# Patient Record
Sex: Male | Born: 1995 | Race: Black or African American | Hispanic: No | Marital: Single | Smoking: Former smoker
Health system: Southern US, Community
[De-identification: ages and names within clinical notes are randomized; demographics above are authoritative.]

## PROBLEM LIST (undated history)

## (undated) DIAGNOSIS — F419 Anxiety disorder, unspecified: Secondary | ICD-10-CM

## (undated) DIAGNOSIS — J45901 Unspecified asthma with (acute) exacerbation: Secondary | ICD-10-CM

## (undated) DIAGNOSIS — M549 Dorsalgia, unspecified: Secondary | ICD-10-CM

## (undated) DIAGNOSIS — Q774 Achondroplasia: Secondary | ICD-10-CM

## (undated) DIAGNOSIS — J45909 Unspecified asthma, uncomplicated: Secondary | ICD-10-CM

---
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
## 2016-11-11 ENCOUNTER — Encounter (HOSPITAL_BASED_OUTPATIENT_CLINIC_OR_DEPARTMENT_OTHER): Payer: Self-pay | Admitting: Emergency Medicine

## 2016-11-11 ENCOUNTER — Emergency Department (HOSPITAL_BASED_OUTPATIENT_CLINIC_OR_DEPARTMENT_OTHER): Payer: BLUE CROSS/BLUE SHIELD

## 2016-11-11 ENCOUNTER — Emergency Department (HOSPITAL_BASED_OUTPATIENT_CLINIC_OR_DEPARTMENT_OTHER)
Admission: EM | Admit: 2016-11-11 | Discharge: 2016-11-11 | Disposition: A | Discharge status: Home / Self Care | Payer: BLUE CROSS/BLUE SHIELD | Attending: Emergency Medicine | Admitting: Emergency Medicine

## 2016-11-11 DIAGNOSIS — J4521 Mild intermittent asthma with (acute) exacerbation: Secondary | ICD-10-CM | POA: Diagnosis not present

## 2016-11-11 DIAGNOSIS — F17200 Nicotine dependence, unspecified, uncomplicated: Secondary | ICD-10-CM | POA: Diagnosis not present

## 2016-11-11 DIAGNOSIS — R079 Chest pain, unspecified: Secondary | ICD-10-CM | POA: Diagnosis present

## 2016-11-11 DIAGNOSIS — Z79899 Other long term (current) drug therapy: Secondary | ICD-10-CM | POA: Insufficient documentation

## 2016-11-11 MED ORDER — PREDNISONE 20 MG PO TABS: 40.0000 mg | ORAL_TABLET | Freq: Every day | ORAL | 0 refills | Status: AC

## 2016-11-11 MED ORDER — PREDNISONE 20 MG PO TABS
40.0000 mg | ORAL_TABLET | Freq: Once | ORAL | Status: AC
  Administered 2016-11-11: 40 mg via ORAL
  Filled 2016-11-11: qty 2

## 2016-11-11 MED ORDER — IPRATROPIUM-ALBUTEROL 0.5-2.5 (3) MG/3ML IN SOLN
3.0000 mL | Freq: Four times a day (QID) | RESPIRATORY_TRACT | Status: DC
  Administered 2016-11-11: 3 mL via RESPIRATORY_TRACT
  Filled 2016-11-11: qty 3

## 2016-11-11 MED FILL — predniSONE 20 MG TABS: 20 | 4 days supply | Qty: 8 | Fill #0

## 2016-11-11 NOTE — Discharge Instructions (Signed)
Please take 40 mg (2 tablets) of Prednisone daily for the next 4 days. You have already been given a dose in the Emergency Department so please take your first dose tomorrow morning with breakfast.   Continue to take albuterol nebulizer treatments every 4 hours as needed for chest tightness and shortness of breath. You can transition to your albuterol inhaler as your symptoms improve.  Wearing a mask or a respirator at work can decrease her exposure to fumes, smoke, biological agents, or dust that can cause problems with your breathing.  If you develop a fever, cough with thick sputum or mucus, vomiting, or any other concerning symptoms, he is return to the emergency department for reevaluation.  If your symptoms persist, but do not worsen, please call the number on your discharge paperwork to get established with a primary care provider.

## 2016-11-11 NOTE — ED Provider Notes (Signed)
[facility] EMERGENCY DEPARTMENT Provider Note   CSN: [id number] Arrival date & time: 11/11/16  [id number]     History   Chief Complaint Chief Complaint  Patient presents with  . Chest Pain    HPI Cameron Caldwell is a 21 y.o. male with a history of asthma who presents to the emergency department with a chief complaint of chest tightness with associated dyspnea and wheezing that began yesterday after he was exposed to paint fumes while at work.  He denies fever, chills, palpitations, cough, N/V/D, abdominal pain, or URI symptoms.  He is treated his symptoms at home by using has albuterol inhaler or taking an albuterol nebulizer treatment every 4 hours since symptom onset without minimal improvement. Symptoms are worse with exertion and improved with resting.  He reports that he has required hospitalization previously for his asthma when he was a child.  No history of intubation or ICU hospitalization. No recent corticosteroid use.     The history is provided by the patient. No language interpreter was used.    Past Medical History:  Diagnosis Date  . Asthma     There are no active problems to display for this patient.   History reviewed. No pertinent surgical history.     Home Medications    Prior to Admission medications   Medication Sig Start Date End Date Taking? Authorizing Provider  albuterol (PROVENTIL HFA;VENTOLIN HFA) 108 (90 BASE) MCG/ACT inhaler Inhale into the lungs every 6 (six) hours as needed for wheezing or shortness of breath.    [provider]  albuterol (PROVENTIL) (2.5 MG/3ML) 0.083% nebulizer solution Take 2.5 mg by nebulization every 6 (six) hours as needed for wheezing or shortness of breath.    [provider]  montelukast (SINGULAIR) 10 MG tablet Take 10 mg by mouth at bedtime.    [provider]  predniSONE (DELTASONE) 20 MG tablet Take 2 tablets (40 mg total) by mouth daily with breakfast. 11/11/16 11/15/16   McDonald, Mia A, PA-C    Family History History reviewed. No pertinent family history.  Social History Social History  Substance Use Topics  . Smoking status: Current Some Day Smoker  . Smokeless tobacco: Never Used  . Alcohol use No     Allergies   Patient has no known allergies.   Review of Systems Review of Systems  Constitutional: Negative for activity change, chills and fever.  HENT: Negative for congestion, rhinorrhea and sore throat.   Respiratory: Positive for chest tightness, shortness of breath and wheezing.   Cardiovascular: Negative for chest pain and palpitations.  Gastrointestinal: Negative for abdominal pain, diarrhea, nausea and vomiting.  Musculoskeletal: Negative for back pain.  Skin: Negative for rash.  Allergic/Immunologic: Negative for immunocompromised state.   Physical Exam Updated Vital Signs BP 131/87 (BP Location: Right Arm)   Pulse 88   Temp 98.5 F (36.9 C) (Oral)   Resp 16   Ht 4\' 7"  (1.397 m)   Wt 56.7 kg (125 lb)   SpO2 96%   BMI 29.05 kg/m   Physical Exam  Constitutional: He appears well-developed.  HENT:  Head: Normocephalic.  Eyes: Conjunctivae are normal.  Neck: Neck supple.  Cardiovascular: Normal rate, regular rhythm and normal heart sounds.  Exam reveals no gallop and no friction rub.   No murmur heard. Normal S1 and S2. Radial pulses are 2+ and symmetric.   Pulmonary/Chest: Effort normal. No respiratory distress. He has wheezes. He has no rales. He exhibits no tenderness.  Diffuse  inspiratory wheezes bilaterally.  No nasal flaring or accessory muscle use.  No tachypnea.  Abdominal: Soft. He exhibits no distension.  Neurological: He is alert.  Skin: Skin is warm and dry.  Psychiatric: His behavior is normal.  Nursing note and vitals reviewed.    ED Treatments / Results  Labs (all labs ordered are listed, but only abnormal results are displayed) Labs Reviewed - No data to display  EKG  EKG  Interpretation  Date/Time:  Thursday November 11 2016 09:11:55 EDT Ventricular Rate:  87 PR Interval:    QRS Duration: 92 QT Interval:  344 QTC Calculation: 414 R Axis:   86 Text Interpretation:  Sinus rhythm ST elevation suggests early repolarization NO STEMI No old tracing to compare Confirmed by Drema Pry [id number]) on 11/11/2016 9:18:43 AM       Radiology Dg Chest 2 View  Result Date: 11/11/2016 CLINICAL DATA:  Wheezing with chest tightness and shortness of breath EXAM: CHEST  2 VIEW COMPARISON:  January 29, 2016 FINDINGS: The lungs are clear. Heart size and pulmonary vascularity are normal. No adenopathy. No evident bone lesions. IMPRESSION: No edema or consolidation. Electronically Signed   By: Bretta Bang III M.D.   On: 11/11/2016 09:45    Procedures Procedures (including critical care time)  Medications Ordered in ED Medications  ipratropium-albuterol (DUONEB) 0.5-2.5 (3) MG/3ML nebulizer solution 3 mL (3 mLs Nebulization Given 11/11/16 0944)  predniSONE (DELTASONE) tablet 40 mg (40 mg Oral Given 11/11/16 0948)     Initial Impression / Assessment and Plan / ED Course  I have reviewed the triage vital signs and the nursing notes.  Pertinent labs & imaging results that were available during my care of the patient were reviewed by me and considered in my medical decision making (see chart for details).     21 year old male with a history of asthma who presents to the emergency department with chest tightness, dyspnea, and wheezing that began yesterday after being exposed to paint fumes at work. CXR is clear without edema or consolidation. EKG with NSR.  The patient was given a dose of prednisone in the emergency department and will be discharged with a 4-day burst.  DuoNeb given with resolution of the patient's wheezing.  The patient reports he is feeling much better and is ready for discharge. SaO2 at 100% on RA.  Strict return precautions given.  No acute distress.   The patient is safe for discharge at this time.  Final Clinical Impressions(s) / ED Diagnoses   Final diagnoses:  Mild intermittent asthma with exacerbation    New Prescriptions New Prescriptions   PREDNISONE (DELTASONE) 20 MG TABLET    Take 2 tablets (40 mg total) by mouth daily with breakfast.     Frederik Pear A, PA-C 11/11/16 1003    Cardama, Amadeo Garnet, MD 11/12/16 1614

## 2016-11-11 NOTE — ED Triage Notes (Signed)
Pt c/o generalized chest tightness after  Being around paint yesterday at work; sts he thinks this is r/t his asthma

## 2017-02-19 ENCOUNTER — Emergency Department (HOSPITAL_BASED_OUTPATIENT_CLINIC_OR_DEPARTMENT_OTHER)
Admission: EM | Admit: 2017-02-19 | Discharge: 2017-02-19 | Disposition: A | Discharge status: Home / Self Care | Payer: BLUE CROSS/BLUE SHIELD | Attending: Emergency Medicine | Admitting: Emergency Medicine

## 2017-02-19 ENCOUNTER — Emergency Department (HOSPITAL_BASED_OUTPATIENT_CLINIC_OR_DEPARTMENT_OTHER): Payer: BLUE CROSS/BLUE SHIELD

## 2017-02-19 ENCOUNTER — Encounter (HOSPITAL_BASED_OUTPATIENT_CLINIC_OR_DEPARTMENT_OTHER): Payer: Self-pay | Admitting: Emergency Medicine

## 2017-02-19 ENCOUNTER — Other Ambulatory Visit: Payer: Self-pay

## 2017-02-19 DIAGNOSIS — J9801 Acute bronchospasm: Secondary | ICD-10-CM | POA: Diagnosis not present

## 2017-02-19 DIAGNOSIS — F17200 Nicotine dependence, unspecified, uncomplicated: Secondary | ICD-10-CM | POA: Diagnosis not present

## 2017-02-19 DIAGNOSIS — Z79899 Other long term (current) drug therapy: Secondary | ICD-10-CM | POA: Insufficient documentation

## 2017-02-19 DIAGNOSIS — R079 Chest pain, unspecified: Secondary | ICD-10-CM | POA: Diagnosis present

## 2017-02-19 HISTORY — DX: Achondroplasia: Q77.4

## 2017-02-19 MED ORDER — DEXAMETHASONE SODIUM PHOSPHATE 10 MG/ML IJ SOLN
10.0000 mg | Freq: Once | INTRAMUSCULAR | Status: AC
  Administered 2017-02-19: 10 mg via INTRAMUSCULAR
  Filled 2017-02-19: qty 1

## 2017-02-19 MED ORDER — ALBUTEROL SULFATE HFA 108 (90 BASE) MCG/ACT IN AERS
2.0000 | INHALATION_SPRAY | RESPIRATORY_TRACT | Status: DC | PRN
  Administered 2017-02-19: 2 via RESPIRATORY_TRACT
  Filled 2017-02-19: qty 6.7

## 2017-02-19 NOTE — ED Triage Notes (Signed)
Pt presents with c/o chest pain and shortness of breath for 2 days 

## 2017-02-19 NOTE — ED Notes (Signed)
C/o mid sternal cp onset yesterday  Pain increased w inspiration,  Denies n/v

## 2017-02-19 NOTE — ED Provider Notes (Signed)
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  He is here with a 2-day history of chest discomfort.  The chest discomfort is located in the center of his chest and he describes it as a fullness.  It is worse when taking a deep breath and he feels more short of breath than usual.  He has increased his use of his home nebulizer without adequate relief.  He does not have an inhaler.  He rates his chest discomfort as a 6 out of 10.  He denies cough or fever.   Past Medical History:  Diagnosis Date  . Achondroplasia   . Asthma     History reviewed. No pertinent surgical history.  No family history on file.  Social History   Tobacco Use  . Smoking status: Current Some Day Smoker  . Smokeless tobacco: Never Used  Substance Use Topics  . Alcohol use: No  . Drug use: No    Prior to Admission medications   Medication Sig Start Date End Date Taking? Authorizing Provider  albuterol (PROVENTIL HFA;VENTOLIN HFA) 108 (90 BASE) MCG/ACT inhaler Inhale into the lungs every 6 (six) hours as needed for wheezing or shortness of breath.    [provider]  albuterol (PROVENTIL) (2.5 MG/3ML) 0.083% nebulizer solution Take 2.5 mg by nebulization every 6 (six) hours as needed for wheezing or shortness of breath.    [provider]  montelukast (SINGULAIR) 10 MG tablet Take 10 mg by mouth at bedtime.    [provider]    Allergies Patient has no known allergies.   REVIEW OF SYSTEMS  Negative except as noted here or in the History of Present Illness.   PHYSICAL EXAMINATION  Initial Vital Signs Blood pressure 132/77, pulse 96, temperature 98.1 F (36.7 C), temperature source Oral, resp. rate (!) 22, SpO2 99  %.  Examination General: Well-developed, well-nourished dwarf male in no acute distress; appearance consistent with age of record HENT: Achondroplastic facies; atraumatic Eyes: pupils equal, round and reactive to light; extraocular muscles intact Neck: supple Heart: regular rate and rhythm; no murmurs Lungs: Faint expiratory wheeze left lower lobe Abdomen: soft; nondistended; nontender; bowel sounds present Extremities: Shortened limbs consistent with achondroplasia; full range of motion; pulses normal Neurologic: Awake, alert and oriented; motor function intact in all extremities and symmetric; no facial droop Skin: Warm and dry Psychiatric: Normal mood and affect   RESULTS  Summary of this visit's results, reviewed by myself:   EKG Interpretation  Date/Time:  Saturday February 19 2017 02:15:35 EST Ventricular Rate:  94 PR Interval:    QRS Duration: 89 QT Interval:  323 QTC Calculation: 404 R Axis:   85 Text Interpretation:  Sinus rhythm Probable left atrial enlargement Early repolarization Rate is faster Confirmed by Brittlyn Cloe ([id number]) on 02/19/2017 3:15:21 AM      Laboratory Studies: No results found for this or any previous visit (from the past 24 hour(s)). Imaging Studies: Dg Chest 2 View  Result Date: 02/19/2017 CLINICAL DATA:  Chest pain with inspiration, shortness of breath for 2 days. EXAM: CHEST  2 VIEW COMPARISON:  Chest radiograph November 11, 2016 FINDINGS: Cardiomediastinal silhouette is normal. No pleural effusions or focal consolidations. Hyperinflation. Trachea projects midline and there is no pneumothorax.  Soft tissue planes and included osseous structures are non-suspicious. IMPRESSION: Hyperinflation without focal consolidation. Electronically Signed   By: Awilda Metro M.D.   On: 02/19/2017 02:49    ED COURSE  Nursing notes and initial vitals signs, including pulse oximetry, reviewed.  Vitals:   02/19/17 0215 02/19/17 0330  BP: 132/77   Pulse: 96    Resp: (!) 22   Temp: 98.1 F (36.7 C)   TempSrc: Oral   SpO2: 99% 100%   3:44 AM Patient feels better after albuterol inhaler treatment.  Chest discomfort has decreased and breathing is easier.  PROCEDURES    ED DIAGNOSES     ICD-10-CM   1. Acute bronchospasm J98.01        Phuoc Huy, Jonny Ruiz, MD 02/19/17 [phone]

## 2017-05-01 ENCOUNTER — Encounter (HOSPITAL_BASED_OUTPATIENT_CLINIC_OR_DEPARTMENT_OTHER): Payer: Self-pay | Admitting: *Deleted

## 2017-05-01 ENCOUNTER — Other Ambulatory Visit: Payer: Self-pay

## 2017-05-01 ENCOUNTER — Emergency Department (HOSPITAL_BASED_OUTPATIENT_CLINIC_OR_DEPARTMENT_OTHER)
Admission: EM | Admit: 2017-05-01 | Discharge: 2017-05-01 | Disposition: A | Discharge status: Home / Self Care | Payer: BLUE CROSS/BLUE SHIELD | Attending: Emergency Medicine | Admitting: Emergency Medicine

## 2017-05-01 DIAGNOSIS — J45909 Unspecified asthma, uncomplicated: Secondary | ICD-10-CM | POA: Insufficient documentation

## 2017-05-01 DIAGNOSIS — F17200 Nicotine dependence, unspecified, uncomplicated: Secondary | ICD-10-CM | POA: Insufficient documentation

## 2017-05-01 DIAGNOSIS — R000 Tachycardia, unspecified: Secondary | ICD-10-CM | POA: Insufficient documentation

## 2017-05-01 DIAGNOSIS — Z79899 Other long term (current) drug therapy: Secondary | ICD-10-CM | POA: Insufficient documentation

## 2017-05-01 DIAGNOSIS — J020 Streptococcal pharyngitis: Secondary | ICD-10-CM

## 2017-05-01 DIAGNOSIS — R509 Fever, unspecified: Secondary | ICD-10-CM | POA: Diagnosis present

## 2017-05-01 DIAGNOSIS — J029 Acute pharyngitis, unspecified: Secondary | ICD-10-CM | POA: Diagnosis not present

## 2017-05-01 LAB — RAPID STREP SCREEN (MED CTR MEBANE ONLY): STREPTOCOCCUS, GROUP A SCREEN (DIRECT): POSITIVE — AB

## 2017-05-01 MED ORDER — PENICILLIN V POTASSIUM 500 MG PO TABS: 500.0000 mg | ORAL_TABLET | Freq: Three times a day (TID) | ORAL | 0 refills | Status: AC

## 2017-05-01 MED ORDER — ACETAMINOPHEN 325 MG PO TABS
650.0000 mg | ORAL_TABLET | Freq: Once | ORAL | Status: AC
  Administered 2017-05-01: 650 mg via ORAL
  Filled 2017-05-01: qty 2

## 2017-05-01 NOTE — ED Notes (Signed)
Pt triaged by this RN 

## 2017-05-01 NOTE — Discharge Instructions (Addendum)
Take Tylenol as directed every 4 hours for pain or for temperature higher than 100.4.  Take the antibiotic as prescribed.  Make sure that you finish the antibiotic.  See your doctor if not feeling better in 4 or 5 days.  Return if concern for any reason

## 2017-05-01 NOTE — ED Triage Notes (Addendum)
Pt states he has had sore throat, drainage and congestion "since Spring". Yesterday developed fever, chills, H/A. Did not get flu shot.

## 2017-05-01 NOTE — ED Provider Notes (Signed)
[facility] EMERGENCY DEPARTMENT Provider Note   CSN: [id number] Arrival date & time: 05/01/17  4098     History   Chief Complaint Chief Complaint  Patient presents with  . Fever    HPI Cameron Caldwell is a 22 y.o. male.  Complains of sore throat onset 4 or 5 days ago and fever onset yesterday temperature was 101 yesterday.  He treated himself with Tylenol, at 3 PM yesterday and that 9 PM yesterday with relief.  He denies cough denies shortness of breath denies abdominal pain, denies urinary symptoms denies chest pain denies vomiting or diarrhea no headache.  No other associated symptoms complains of sore throat which is worse with swallowing.  Pain is mild to moderate at present.  No other associated symptoms  HPI  Past Medical History:  Diagnosis Date  . Achondroplasia   . Asthma     There are no active problems to display for this patient.   History reviewed. No pertinent surgical history.      Home Medications    Prior to Admission medications   Medication Sig Start Date End Date Taking? Authorizing Provider  albuterol (PROVENTIL HFA;VENTOLIN HFA) 108 (90 BASE) MCG/ACT inhaler Inhale into the lungs every 6 (six) hours as needed for wheezing or shortness of breath.    [provider]  albuterol (PROVENTIL) (2.5 MG/3ML) 0.083% nebulizer solution Take 2.5 mg by nebulization every 6 (six) hours as needed for wheezing or shortness of breath.    [provider]  montelukast (SINGULAIR) 10 MG tablet Take 10 mg by mouth at bedtime.    [provider]    Family History History reviewed. No pertinent family history.  Social History Social History   Tobacco Use  . Smoking status: Current Some Day Smoker  . Smokeless tobacco: Never Used  Substance Use Topics  . Alcohol use: No  . Drug use: No     Allergies   Patient has no known allergies.   Review of Systems Review of Systems  Constitutional: Positive for fever.  HENT:  Positive for sore throat.   All other systems reviewed and are negative.    Physical Exam Updated Vital Signs BP 130/85 (BP Location: Left Arm)   Pulse (!) 109   Temp (!) 102.9 F (39.4 C) (Oral)   Resp 18   Ht 4\' 6"  (1.372 m)   Wt 54.4 kg (120 lb)   SpO2 99%   BMI 28.93 kg/m   Physical Exam  Constitutional: He appears well-developed and well-nourished.  HENT:  Head: Normocephalic and atraumatic.  Nose: Nose normal.  Mouth/Throat: No oropharyngeal exudate.  Oropharynx reddened uvula midline tonsils not enlarged.  Handling secretions well.  Bilateral tympanic membranes normal  Eyes: Pupils are equal, round, and reactive to light. Conjunctivae are normal.  Neck: Neck supple. No tracheal deviation present. No thyromegaly present.  Cardiovascular: Regular rhythm.  No murmur heard. Mildly tachycardic  Pulmonary/Chest: Effort normal and breath sounds normal.  Abdominal: Soft. Bowel sounds are normal. He exhibits no distension. There is no tenderness.  Genitourinary: Penis normal.  Musculoskeletal: Normal range of motion. He exhibits no edema or tenderness.  Lymphadenopathy:    He has no cervical adenopathy.  Neurological: He is alert. Coordination normal.  Skin: Skin is warm and dry. No rash noted.  Psychiatric: He has a normal mood and affect.  Nursing note and vitals reviewed.    ED Treatments / Results  Labs (all labs ordered are listed, but only abnormal results  are displayed) Labs Reviewed - No data to display  EKG None  Radiology No results found.  Procedures Procedures (including critical care time)  Medications Ordered in ED Medications  acetaminophen (TYLENOL) tablet 650 mg (650 mg Oral Given 05/01/17 0648)   Results for orders placed or performed during the hospital encounter of 05/01/17  Rapid Strep Screen ([facility])  Result Value Ref Range   Streptococcus, Group A Screen (Direct) POSITIVE (A) NEGATIVE   No results found.  Initial  Impression / Assessment and Plan / ED Course  I have reviewed the triage vital signs and the nursing notes.  Pertinent labs & imaging results that were available during my care of the patient were reviewed by me and considered in my medical decision making (see chart for details).     8:05 AM patient resting comfortably after treatment with Tylenol.  No distress.  Plan prescription Pen-Vee K.  Tylenol.  Follow-up with PMD if not feeling better in 4 or 5 days  Final Clinical Impressions(s) / ED Diagnoses  Diagnosis strep pharyngitis Final diagnoses:  None    ED Discharge Orders    None       Doug SouJacubowitz, Ravin Bendall, MD 05/01/17 [phone]

## 2017-05-16 ENCOUNTER — Other Ambulatory Visit: Payer: Self-pay

## 2017-05-16 ENCOUNTER — Emergency Department (HOSPITAL_BASED_OUTPATIENT_CLINIC_OR_DEPARTMENT_OTHER)
Admission: EM | Admit: 2017-05-16 | Discharge: 2017-05-16 | Disposition: A | Discharge status: Home / Self Care | Payer: BLUE CROSS/BLUE SHIELD | Attending: Emergency Medicine | Admitting: Emergency Medicine

## 2017-05-16 DIAGNOSIS — J45909 Unspecified asthma, uncomplicated: Secondary | ICD-10-CM | POA: Diagnosis not present

## 2017-05-16 DIAGNOSIS — J020 Streptococcal pharyngitis: Secondary | ICD-10-CM

## 2017-05-16 DIAGNOSIS — J029 Acute pharyngitis, unspecified: Secondary | ICD-10-CM | POA: Diagnosis present

## 2017-05-16 LAB — RAPID STREP SCREEN (MED CTR MEBANE ONLY): Streptococcus, Group A Screen (Direct): POSITIVE — AB

## 2017-05-16 MED ORDER — CEPHALEXIN 500 MG PO CAPS: 500.0000 mg | ORAL_CAPSULE | Freq: Two times a day (BID) | ORAL | 0 refills | Status: DC

## 2017-05-16 NOTE — Discharge Instructions (Addendum)
You were seen today for sore throat.  You continue to be strep positive.  You will be placed on a different antibiotic.  Make sure to change toothbrushes and avoid sick contacts.

## 2017-05-16 NOTE — ED Triage Notes (Signed)
Pt seen here 2 weeks ago and diagnosed with strep. Thinks his girlfriend has given it to him again. C/O sore throat x 1 day.

## 2017-05-16 NOTE — ED Provider Notes (Signed)
[facility] EMERGENCY DEPARTMENT Provider Note   CSN: [id number] Arrival date & time: 05/16/17  0012     History   Chief Complaint Chief Complaint  Patient presents with  . Sore Throat    HPI Cameron Caldwell is a 22 y.o. male.  HPI  This is a 22 year old male who presents with sore throat.  Patient reports that he was diagnosed with strep throat 2 weeks ago.  He finished a course of penicillin.  He states that his symptoms got better.  However over the last 24 hours he has had worsening sore throat again.  Denies fevers.  He reports that his girlfriend re-contracted strep throat and was placed on a second course of antibiotics.  He is fearful that he has strep throat again.  He denies any other symptoms including nasal congestion, cough, chest pain, shortness of breath.  Denies difficulty swallowing.  Currently rates his pain at 7 out of 10.  He has not taken anything for pain.  Past Medical History:  Diagnosis Date  . Achondroplasia   . Asthma     There are no active problems to display for this patient.   No past surgical history on file.      Home Medications    Prior to Admission medications   Medication Sig Start Date End Date Taking? Authorizing Provider  albuterol (PROVENTIL HFA;VENTOLIN HFA) 108 (90 BASE) MCG/ACT inhaler Inhale into the lungs every 6 (six) hours as needed for wheezing or shortness of breath.   Yes [provider]  albuterol (PROVENTIL) (2.5 MG/3ML) 0.083% nebulizer solution Take 2.5 mg by nebulization every 6 (six) hours as needed for wheezing or shortness of breath.   Yes [provider]  cephALEXin (KEFLEX) 500 MG capsule Take 1 capsule (500 mg total) by mouth 2 (two) times daily. 05/16/17   Celestina Gironda, Mayer Masker, MD  montelukast (SINGULAIR) 10 MG tablet Take 10 mg by mouth at bedtime.    [provider]    Family History No family history on file.  Social History Social History   Tobacco Use  . Smoking  status: Current Some Day Smoker  . Smokeless tobacco: Never Used  Substance Use Topics  . Alcohol use: No  . Drug use: No     Allergies   Patient has no known allergies.   Review of Systems Review of Systems  Constitutional: Negative for fever.  HENT: Positive for sore throat. Negative for congestion and trouble swallowing.   Respiratory: Negative for cough and shortness of breath.   Cardiovascular: Negative for chest pain.  Gastrointestinal: Negative for nausea and vomiting.  All other systems reviewed and are negative.    Physical Exam Updated Vital Signs BP 131/80   Pulse 87   Temp 97.9 F (36.6 C) (Oral)   Resp 15   Ht  (1.346 m)   Wt 53.5 kg (118 lb)   SpO2 100%   BMI 29.53 kg/m   Physical Exam  Constitutional: He is oriented to person, place, and time. He appears well-developed and well-nourished.  dwarf characteristics  HENT:  Head: Normocephalic and atraumatic.  Mild errythema posterior oropharynx, no tonsillar exudate, uvula midline  Neck: Neck supple.  Cardiovascular: Normal rate, regular rhythm and normal heart sounds.  No murmur heard. Pulmonary/Chest: Effort normal and breath sounds normal. No respiratory distress. He has no wheezes.  Musculoskeletal: He exhibits no edema.  Lymphadenopathy:    He has no cervical adenopathy.  Neurological: He is alert and oriented  to person, place, and time.  Skin: Skin is warm and dry.  Psychiatric: He has a normal mood and affect.  Nursing note and vitals reviewed.    ED Treatments / Results  Labs (all labs ordered are listed, but only abnormal results are displayed) Labs Reviewed  RAPID STREP SCREEN ([facility] ONLY) - Abnormal; Notable for the following components:      Result Value   Streptococcus, Group A Screen (Direct) POSITIVE (*)    All other components within normal limits    EKG None  Radiology No results found.  Procedures Procedures (including critical care time)  Medications  Ordered in ED Medications - No data to display   Initial Impression / Assessment and Plan / ED Course  I have reviewed the triage vital signs and the nursing notes.  Pertinent labs & imaging results that were available during my care of the patient were reviewed by me and considered in my medical decision making (see chart for details).     Patient presents with concerns for recurrent strep throat.  Reports symptoms improved with treatment but have returned.  He is overall nontoxic-appearing and afebrile.  Repeat strep screen is positive.  Unclear what this is his true reinfection versus persistent infection versus colonization.  Given that he is symptomatic, will treat.  Previously treated with penicillin.  Will treat with Keflex.  Patient advised to throw away toothbrushes or anything that may have been contaminated.  After history, exam, and medical workup I feel the patient has been appropriately medically screened and is safe for discharge home. Pertinent diagnoses were discussed with the patient. Patient was given return precautions.   Final Clinical Impressions(s) / ED Diagnoses   Final diagnoses:  Strep throat    ED Discharge Orders        Ordered    cephALEXin (KEFLEX) 500 MG capsule  2 times daily     05/16/17 0213       Shon Baton, MD 05/16/17 [phone]

## 2017-06-06 ENCOUNTER — Other Ambulatory Visit: Payer: Self-pay

## 2017-06-06 ENCOUNTER — Encounter (HOSPITAL_BASED_OUTPATIENT_CLINIC_OR_DEPARTMENT_OTHER): Payer: Self-pay | Admitting: *Deleted

## 2017-06-06 ENCOUNTER — Emergency Department (HOSPITAL_BASED_OUTPATIENT_CLINIC_OR_DEPARTMENT_OTHER)
Admission: EM | Admit: 2017-06-06 | Discharge: 2017-06-06 | Disposition: A | Discharge status: Home / Self Care | Payer: BLUE CROSS/BLUE SHIELD | Attending: Emergency Medicine | Admitting: Emergency Medicine

## 2017-06-06 DIAGNOSIS — J45909 Unspecified asthma, uncomplicated: Secondary | ICD-10-CM | POA: Diagnosis not present

## 2017-06-06 DIAGNOSIS — R110 Nausea: Secondary | ICD-10-CM

## 2017-06-06 DIAGNOSIS — R197 Diarrhea, unspecified: Secondary | ICD-10-CM | POA: Insufficient documentation

## 2017-06-06 DIAGNOSIS — Z79899 Other long term (current) drug therapy: Secondary | ICD-10-CM | POA: Diagnosis not present

## 2017-06-06 DIAGNOSIS — J069 Acute upper respiratory infection, unspecified: Secondary | ICD-10-CM | POA: Insufficient documentation

## 2017-06-06 DIAGNOSIS — H9202 Otalgia, left ear: Secondary | ICD-10-CM | POA: Insufficient documentation

## 2017-06-06 DIAGNOSIS — F17200 Nicotine dependence, unspecified, uncomplicated: Secondary | ICD-10-CM | POA: Insufficient documentation

## 2017-06-06 DIAGNOSIS — R1013 Epigastric pain: Secondary | ICD-10-CM | POA: Diagnosis not present

## 2017-06-06 MED ORDER — GI COCKTAIL ~~LOC~~
30.0000 mL | Freq: Once | ORAL | Status: AC
  Administered 2017-06-06: 30 mL via ORAL
  Filled 2017-06-06: qty 30

## 2017-06-06 MED ORDER — ONDANSETRON 4 MG PO TBDP: 4.0000 mg | ORAL_TABLET | Freq: Three times a day (TID) | ORAL | 0 refills | Status: DC | PRN

## 2017-06-06 MED ORDER — SALINE SPRAY 0.65 % NA SOLN: 1.0000 | NASAL | 0 refills | Status: DC | PRN

## 2017-06-06 MED ORDER — FLUTICASONE PROPIONATE 50 MCG/ACT NA SUSP: 2.0000 | Freq: Every day | NASAL | 0 refills | Status: DC

## 2017-06-06 MED ORDER — ONDANSETRON 4 MG PO TBDP
4.0000 mg | ORAL_TABLET | Freq: Once | ORAL | Status: AC
  Administered 2017-06-06: 4 mg via ORAL
  Filled 2017-06-06: qty 1

## 2017-06-06 NOTE — ED Triage Notes (Signed)
Pt c/o upper mid abd pain that started yesterday. Pain comes and goes. Describes pain as dull. C/o nausea. Denies vomiting or diarrhea. Denies fevers. Denies any urinary symptoms. C/o feeling congested and left ear hurting.

## 2017-06-06 NOTE — Discharge Instructions (Addendum)
You were seen today for upper respiratory symptoms and nausea.  Take medication as prescribed.  Use nasal saline or Flonase to open up your eustachian tubes.  Tylenol or ibuprofen for pain.  Avoid foods that exacerbate your symptoms.

## 2017-06-06 NOTE — ED Notes (Signed)
No chahges. "feel better", denies pain or nausea. Alert, NAD, calm, interactive, resps e/u, speaking in clear complete sentences, no dyspnea noted, skin W&D. "ready to go".

## 2017-06-06 NOTE — ED Notes (Signed)
Alert, NAD, calm, interactive, resps e/u, speaking in clear complete sentences, no dyspnea noted, skin W&D, initial VSS, c/o mid upper epigastric abd pain, some nausea and loose stools, (denies: sob, fever, vomiting, bleeding, dizziness or visual changes). Family at [facility].

## 2017-06-06 NOTE — ED Provider Notes (Signed)
[facility] EMERGENCY DEPARTMENT Provider Note   CSN: [id number] Arrival date & time: 06/06/17  9562     History   Chief Complaint Chief Complaint  Patient presents with  . Abdominal Pain    HPI Cameron Caldwell is a 22 y.o. male.  HPI  This is a 22 year old male with a history of achondroplasia and asthma who presents with nausea and left ear pain.  Patient reports 1 day history of nausea.  Worse with eating.  He has reported some upper respiratory symptoms including congestion, left ear pain.  Also has noted some diarrhea.  Denies any abdominal pain or vomiting.  He has not taken anything for his symptoms.  He does report some abdominal fullness which is worse after eating.  No known sick contacts.  Denies fevers.  He has not tried anything for his symptoms.  Past Medical History:  Diagnosis Date  . Achondroplasia   . Asthma     There are no active problems to display for this patient.   History reviewed. No pertinent surgical history.      Home Medications    Prior to Admission medications   Medication Sig Start Date End Date Taking? Authorizing Provider  albuterol (PROVENTIL HFA;VENTOLIN HFA) 108 (90 BASE) MCG/ACT inhaler Inhale into the lungs every 6 (six) hours as needed for wheezing or shortness of breath.    [provider]  albuterol (PROVENTIL) (2.5 MG/3ML) 0.083% nebulizer solution Take 2.5 mg by nebulization every 6 (six) hours as needed for wheezing or shortness of breath.    [provider]  cephALEXin (KEFLEX) 500 MG capsule Take 1 capsule (500 mg total) by mouth 2 (two) times daily. 05/16/17   Lavon Horn, Mayer Masker, MD  fluticasone (FLONASE) 50 MCG/ACT nasal spray Place 2 sprays into both nostrils daily. 06/06/17   Imya Mance, Mayer Masker, MD  montelukast (SINGULAIR) 10 MG tablet Take 10 mg by mouth at bedtime.    [provider]  ondansetron (ZOFRAN ODT) 4 MG disintegrating tablet Take 1 tablet (4 mg total) by mouth every 8  (eight) hours as needed for nausea or vomiting. 06/06/17   Jireh Elmore, Mayer Masker, MD  sodium chloride (OCEAN) 0.65 % SOLN nasal spray Place 1 spray into both nostrils as needed for congestion. 06/06/17   Cindy Fullman, Mayer Masker, MD    Family History No family history on file.  Social History Social History   Tobacco Use  . Smoking status: Current Some Day Smoker  . Smokeless tobacco: Never Used  Substance Use Topics  . Alcohol use: Yes    Comment: occasional   . Drug use: No     Allergies   Patient has no known allergies.   Review of Systems Review of Systems  Constitutional: Negative for fever.  HENT: Positive for congestion and ear pain. Negative for sore throat.   Respiratory: Negative for cough and shortness of breath.   Cardiovascular: Negative for chest pain.  Gastrointestinal: Positive for diarrhea and nausea. Negative for abdominal pain and vomiting.  All other systems reviewed and are negative.    Physical Exam Updated Vital Signs BP (!) 142/89 (BP Location: Right Arm)   Pulse 91   Temp 98.3 F (36.8 C) (Oral)   Resp 16   Ht  (1.346 m)   Wt 54.4 kg (120 lb)   SpO2 98%   BMI 30.04 kg/m   Physical Exam  Constitutional: He is oriented to person, place, and time. He appears well-developed and well-nourished. No distress.  HENT:  Head: Normocephalic and atraumatic.  Mouth/Throat: Oropharynx is clear and moist.  Bilateral TMs with slight dulling of the light reflex, no bulging or erythema noted  Neck: Neck supple.  Cardiovascular: Normal rate, regular rhythm and normal heart sounds.  No murmur heard. Pulmonary/Chest: Effort normal and breath sounds normal. No respiratory distress. He has no wheezes.  Abdominal: Soft. Bowel sounds are normal. There is no tenderness. There is no rebound and no guarding.  Musculoskeletal: He exhibits no edema.  Lymphadenopathy:    He has cervical adenopathy.  Neurological: He is alert and oriented to person, place, and time.    Skin: Skin is warm and dry.  Psychiatric: He has a normal mood and affect.  Nursing note and vitals reviewed.    ED Treatments / Results  Labs (all labs ordered are listed, but only abnormal results are displayed) Labs Reviewed - No data to display  EKG None  Radiology No results found.  Procedures Procedures (including critical care time)  Medications Ordered in ED Medications  gi cocktail (Maalox,Lidocaine,Donnatal) (30 mLs Oral Given 06/06/17 0330)  ondansetron (ZOFRAN-ODT) disintegrating tablet 4 mg (4 mg Oral Given 06/06/17 0330)     Initial Impression / Assessment and Plan / ED Course  I have reviewed the triage vital signs and the nursing notes.  Pertinent labs & imaging results that were available during my care of the patient were reviewed by me and considered in my medical decision making (see chart for details).     She presents with upper respiratory symptoms, nausea, diarrhea.  He is overall nontoxic-appearing and vital signs are reassuring.  His exam is fairly benign.  No evidence of acute otitis.  His abdomen is soft.  Doubt pancreatitis, cholecystitis.  Highly suspect related to acute viral etiology.  Patient was given GI cocktail and Zofran.  He reports he feels much better.  Would defer antibiotics at this time.  Recommend Flonase and nasal saline for ear pain and upper respiratory congestion.  Zofran as needed for nausea.  After history, exam, and medical workup I feel the patient has been appropriately medically screened and is safe for discharge home. Pertinent diagnoses were discussed with the patient. Patient was given return precautions.   Final Clinical Impressions(s) / ED Diagnoses   Final diagnoses:  Viral upper respiratory tract infection  Nausea  Left ear pain    ED Discharge Orders        Ordered    ondansetron (ZOFRAN ODT) 4 MG disintegrating tablet  Every 8 hours PRN     06/06/17 0357    fluticasone (FLONASE) 50 MCG/ACT nasal spray   Daily     06/06/17 0357    sodium chloride (OCEAN) 0.65 % SOLN nasal spray  As needed     06/06/17 0357       Shon Baton, MD 06/06/17 [phone]

## 2017-06-06 NOTE — ED Notes (Signed)
EDP into room, prior to RN assessment, see MD notes, pending orders.   

## 2017-07-18 ENCOUNTER — Encounter (HOSPITAL_BASED_OUTPATIENT_CLINIC_OR_DEPARTMENT_OTHER): Payer: Self-pay | Admitting: *Deleted

## 2017-07-18 ENCOUNTER — Other Ambulatory Visit: Payer: Self-pay

## 2017-07-18 ENCOUNTER — Emergency Department (HOSPITAL_BASED_OUTPATIENT_CLINIC_OR_DEPARTMENT_OTHER)
Admission: EM | Admit: 2017-07-18 | Discharge: 2017-07-18 | Disposition: A | Discharge status: Home / Self Care | Payer: BLUE CROSS/BLUE SHIELD | Attending: Emergency Medicine | Admitting: Emergency Medicine

## 2017-07-18 DIAGNOSIS — Y9383 Activity, rough housing and horseplay: Secondary | ICD-10-CM | POA: Insufficient documentation

## 2017-07-18 DIAGNOSIS — Y998 Other external cause status: Secondary | ICD-10-CM | POA: Diagnosis not present

## 2017-07-18 DIAGNOSIS — F17200 Nicotine dependence, unspecified, uncomplicated: Secondary | ICD-10-CM | POA: Insufficient documentation

## 2017-07-18 DIAGNOSIS — T148XXA Other injury of unspecified body region, initial encounter: Secondary | ICD-10-CM

## 2017-07-18 DIAGNOSIS — S39012A Strain of muscle, fascia and tendon of lower back, initial encounter: Secondary | ICD-10-CM | POA: Diagnosis not present

## 2017-07-18 DIAGNOSIS — X500XXA Overexertion from strenuous movement or load, initial encounter: Secondary | ICD-10-CM | POA: Diagnosis not present

## 2017-07-18 DIAGNOSIS — Y929 Unspecified place or not applicable: Secondary | ICD-10-CM | POA: Diagnosis not present

## 2017-07-18 DIAGNOSIS — M545 Low back pain: Secondary | ICD-10-CM

## 2017-07-18 DIAGNOSIS — S3992XA Unspecified injury of lower back, initial encounter: Secondary | ICD-10-CM | POA: Diagnosis present

## 2017-07-18 DIAGNOSIS — Z79899 Other long term (current) drug therapy: Secondary | ICD-10-CM | POA: Diagnosis not present

## 2017-07-18 DIAGNOSIS — J45909 Unspecified asthma, uncomplicated: Secondary | ICD-10-CM | POA: Insufficient documentation

## 2017-07-18 MED ORDER — METHOCARBAMOL 500 MG PO TABS: 500.0000 mg | ORAL_TABLET | Freq: Two times a day (BID) | ORAL | 0 refills | Status: DC

## 2017-07-18 NOTE — ED Triage Notes (Signed)
Lower back pain with radiation into his left leg since wrestling with a friend yesterday.

## 2017-07-18 NOTE — Discharge Instructions (Signed)
You can take Tylenol or Ibuprofen as directed for pain. You can alternate Tylenol and Ibuprofen every 4 hours. If you take Tylenol at 1pm, then you can take Ibuprofen at 5pm. Then you can take Tylenol again at 9pm.   Take Robaxin as prescribed. This medication will make you drowsy so do not drive or drink alcohol when taking it.  Apply heat to the affected area.  Return to the Emergency Department immediately for any worsening back pain, neck pain, difficulty walking, numbness/weaknss of your arms or legs, urinary or bowel accidents, fever or any other worsening or concerning symptoms.

## 2017-07-18 NOTE — ED Provider Notes (Signed)
[facility] EMERGENCY DEPARTMENT Provider Note   CSN: [id number] Arrival date & time: 07/18/17  1705     History   Chief Complaint Chief Complaint  Patient presents with  . Back Injury    HPI Cameron Caldwell is a 22 y.o. male with PMH/o Achondroplasia, Asthma who presents for evaluation of left lower back pain that began yesterday.  Patient reports he was wrestling with his friend and afterwards started developing some left lower back pain that extends into his leg.  He states he was not slammed to the ground, did not fall to the ground did not have any other injury.  He states that his symptoms began after the wrestling.  He reports he has been taking Advil with minimal improvement in symptoms.  His pain is worsened with movement, particular bending down.  He has been able to ambulate and bear weight on his leg without any difficulty. Denies fevers, weight loss, numbness/weakness of upper and lower extremities, bowel/bladder incontinence, saddle anesthesia, history of back surgery, history of IVDA.   The history is provided by the patient.    Past Medical History:  Diagnosis Date  . Achondroplasia   . Asthma     There are no active problems to display for this patient.   History reviewed. No pertinent surgical history.      Home Medications    Prior to Admission medications   Medication Sig Start Date End Date Taking? Authorizing Provider  albuterol (PROVENTIL HFA;VENTOLIN HFA) 108 (90 BASE) MCG/ACT inhaler Inhale into the lungs every 6 (six) hours as needed for wheezing or shortness of breath.    [provider]  albuterol (PROVENTIL) (2.5 MG/3ML) 0.083% nebulizer solution Take 2.5 mg by nebulization every 6 (six) hours as needed for wheezing or shortness of breath.    [provider]  cephALEXin (KEFLEX) 500 MG capsule Take 1 capsule (500 mg total) by mouth 2 (two) times daily. 05/16/17   Horton, Mayer Masker, MD  fluticasone (FLONASE) 50 MCG/ACT  nasal spray Place 2 sprays into both nostrils daily. 06/06/17   Horton, Mayer Masker, MD  methocarbamol (ROBAXIN) 500 MG tablet Take 1 tablet (500 mg total) by mouth 2 (two) times daily. 07/18/17   Maxwell Caul, PA-C  montelukast (SINGULAIR) 10 MG tablet Take 10 mg by mouth at bedtime.    [provider]  ondansetron (ZOFRAN ODT) 4 MG disintegrating tablet Take 1 tablet (4 mg total) by mouth every 8 (eight) hours as needed for nausea or vomiting. 06/06/17   Horton, Mayer Masker, MD  sodium chloride (OCEAN) 0.65 % SOLN nasal spray Place 1 spray into both nostrils as needed for congestion. 06/06/17   Horton, Mayer Masker, MD    Family History No family history on file.  Social History Social History   Tobacco Use  . Smoking status: Current Some Day Smoker  . Smokeless tobacco: Never Used  Substance Use Topics  . Alcohol use: Yes    Comment: occasional   . Drug use: No     Allergies   Patient has no known allergies.   Review of Systems Review of Systems  Constitutional: Negative for fever.  Musculoskeletal: Positive for back pain.       Left leg pain  Neurological: Negative for weakness and numbness.     Physical Exam Updated Vital Signs BP 123/83   Pulse 83   Temp 98.5 F (36.9 C) (Oral)   Resp 20   Wt 54.4 kg (120 lb)  SpO2 100%   BMI 30.04 kg/m   Physical Exam  Constitutional: He appears well-developed and well-nourished.  HENT:  Head: Normocephalic and atraumatic.  Eyes: Conjunctivae and EOM are normal. Right eye exhibits no discharge. Left eye exhibits no discharge. No scleral icterus.  Neck: Full passive range of motion without pain. No spinous process tenderness present.  Full flexion/extension and lateral movement of neck fully intact. No bony midline tenderness. No deformities or crepitus.   Pulmonary/Chest: Effort normal.  Musculoskeletal:       Thoracic back: He exhibits no tenderness.       Lumbar back: He exhibits no bony tenderness.        Back:  No midline T or L-spine tenderness.  No deformity or crepitus noted.  Diffuse muscular tenderness with overlying spasm to the left lumbar paraspinal muscles.  This extends down into the gluteal muscle.  Flexion and extension intact but patient does report worsening pain with flexion.  No bony tenderness noted to the left hip.  No deformity or crepitus noted.  Full range of motion of left lower extremity intact without any difficulty.  Neurological: He is alert.  Follows commands, Moves all extremities  5/5 strength to BUE and BLE  Sensation intact throughout all major nerve distributions Normal gait  Skin: Skin is warm and dry.  Psychiatric: He has a normal mood and affect. His speech is normal and behavior is normal.  Nursing note and vitals reviewed.    ED Treatments / Results  Labs (all labs ordered are listed, but only abnormal results are displayed) Labs Reviewed - No data to display  EKG None  Radiology No results found.  Procedures Procedures (including critical care time)  Medications Ordered in ED Medications - No data to display   Initial Impression / Assessment and Plan / ED Course  I have reviewed the triage vital signs and the nursing notes.  Pertinent labs & imaging results that were available during my care of the patient were reviewed by me and considered in my medical decision making (see chart for details).     22 year old male past medical history of end quadriplegia who presents for evaluation of left back pain that radiates to the left leg that began after he was wrestling with a friend yesterday.  Denies any trauma, injury, fall.  Reports pain started after he stopped wrestling.  Has been able to ambulate and bear weight without any difficulty. Patient is afebrile, non-toxic appearing, sitting comfortably on examination table. Vital signs reviewed and stable.  No red flags or neuro deficits noted on exam.  On exam, he has no midline C, T or L-spine  tenderness.  He has diffuse muscular tenderness with overlying spasm to the left-sided paraspinal muscles of the lumbar region that extends in the gluteal region.  No bony tenderness noted to the hip.  Normal gait.  Pain is worsened with movement.  I suspect that this is muscle strain given history/physical exam.  Patient has no midline bony tenderness that would be concerning for an acute bony abnormality.  Additionally, patient has no bony tenderness to the left lower leg.  No indication for x-ray imaging at this time.  We will plan to treat as a muscle strain.  Patient with no known drug allergies. Patient had ample opportunity for questions and discussion. All patient's questions were answered with full understanding. Strict return precautions discussed. Patient expresses understanding and agreement to plan.   Final Clinical Impressions(s) / ED Diagnoses   Final  diagnoses:  Acute left-sided low back pain, with sciatica presence unspecified  Muscle strain    ED Discharge Orders        Ordered    methocarbamol (ROBAXIN) 500 MG tablet  2 times daily     07/18/17 1811       Rosana HoesLayden, Lindsey A, PA-C 07/18/17 1816    Raeford RazorKohut, Stephen, MD 07/18/17 2031

## 2017-08-10 ENCOUNTER — Encounter (HOSPITAL_BASED_OUTPATIENT_CLINIC_OR_DEPARTMENT_OTHER): Payer: Self-pay | Admitting: Emergency Medicine

## 2017-08-10 ENCOUNTER — Other Ambulatory Visit: Payer: Self-pay

## 2017-08-10 ENCOUNTER — Emergency Department (HOSPITAL_BASED_OUTPATIENT_CLINIC_OR_DEPARTMENT_OTHER)
Admission: EM | Admit: 2017-08-10 | Discharge: 2017-08-10 | Disposition: A | Discharge status: Home / Self Care | Payer: BLUE CROSS/BLUE SHIELD | Attending: Emergency Medicine | Admitting: Emergency Medicine

## 2017-08-10 DIAGNOSIS — J45909 Unspecified asthma, uncomplicated: Secondary | ICD-10-CM | POA: Diagnosis not present

## 2017-08-10 DIAGNOSIS — F17200 Nicotine dependence, unspecified, uncomplicated: Secondary | ICD-10-CM | POA: Diagnosis not present

## 2017-08-10 DIAGNOSIS — R0981 Nasal congestion: Secondary | ICD-10-CM | POA: Diagnosis present

## 2017-08-10 DIAGNOSIS — J069 Acute upper respiratory infection, unspecified: Secondary | ICD-10-CM | POA: Diagnosis not present

## 2017-08-10 MED ORDER — PREDNISONE 20 MG PO TABS: 40.0000 mg | ORAL_TABLET | Freq: Every day | ORAL | 0 refills | Status: AC

## 2017-08-10 MED ORDER — DM-GUAIFENESIN ER 30-600 MG PO TB12: 1.0000 | ORAL_TABLET | Freq: Two times a day (BID) | ORAL | 0 refills | Status: DC

## 2017-08-10 MED ORDER — PREDNISONE 50 MG PO TABS
60.0000 mg | ORAL_TABLET | Freq: Once | ORAL | Status: AC
  Administered 2017-08-10: 60 mg via ORAL
  Filled 2017-08-10: qty 1

## 2017-08-10 MED ORDER — ALBUTEROL SULFATE (2.5 MG/3ML) 0.083% IN NEBU: 2.5000 mg | INHALATION_SOLUTION | Freq: Four times a day (QID) | RESPIRATORY_TRACT | 0 refills | Status: DC | PRN

## 2017-08-10 MED ORDER — ALBUTEROL SULFATE HFA 108 (90 BASE) MCG/ACT IN AERS: 2.0000 | INHALATION_SPRAY | RESPIRATORY_TRACT | 0 refills | Status: DC | PRN

## 2017-08-10 NOTE — ED Triage Notes (Signed)
Pt with cough and congestion x 2 days.

## 2017-08-10 NOTE — ED Provider Notes (Signed)
[facility] EMERGENCY DEPARTMENT Provider Note   CSN: [id number] Arrival date & time: 08/10/17  0343     History   Chief Complaint Chief Complaint  Patient presents with  . URI    HPI Cameron Caldwell is a 22 y.o. male.  HPI 22 year old male presents to the emergency department with complaints of nasal congestion and productive cough.  He has a history of asthma but reports no significant wheezing.  Has been using his bronchodilators with some improvement in his symptoms.  No fevers or chills.  No abdominal pain.  Some chest tightness.   Past Medical History:  Diagnosis Date  . Achondroplasia   . Asthma     There are no active problems to display for this patient.   History reviewed. No pertinent surgical history.      Home Medications    Prior to Admission medications   Medication Sig Start Date End Date Taking? Authorizing Provider  albuterol (PROVENTIL HFA;VENTOLIN HFA) 108 (90 BASE) MCG/ACT inhaler Inhale into the lungs every 6 (six) hours as needed for wheezing or shortness of breath.   Yes [provider]  albuterol (PROVENTIL) (2.5 MG/3ML) 0.083% nebulizer solution Take 2.5 mg by nebulization every 6 (six) hours as needed for wheezing or shortness of breath.   Yes [provider]  dextromethorphan-guaiFENesin (MUCINEX DM) 30-600 MG 12hr tablet Take 1 tablet by mouth 2 (two) times daily. 08/10/17   Azalia Bilis, MD  predniSONE (DELTASONE) 20 MG tablet Take 2 tablets (40 mg total) by mouth daily for 5 days. 08/10/17 08/15/17  Azalia Bilis, MD    Family History No family history on file.  Social History Social History   Tobacco Use  . Smoking status: Current Some Day Smoker  . Smokeless tobacco: Never Used  Substance Use Topics  . Alcohol use: Yes    Comment: occasional   . Drug use: No     Allergies   Patient has no known allergies.   Review of Systems Review of Systems  All other systems reviewed and are  negative.    Physical Exam Updated Vital Signs BP 137/89   Pulse 85   Temp 98.1 F (36.7 C)   Resp 18   Ht 4\' 7"  (1.397 m)   Wt 56.2 kg (124 lb)   SpO2 100%   BMI 28.82 kg/m   Physical Exam  Constitutional: He is oriented to person, place, and time. He appears well-developed and well-nourished.  HENT:  Head: Normocephalic and atraumatic.  Bilateral TMs are normal.  Posterior pharynx is normal.  Eyes: EOM are normal.  Neck: Normal range of motion.  Cardiovascular: Normal rate, regular rhythm and normal heart sounds.  Pulmonary/Chest: Effort normal and breath sounds normal. No respiratory distress. He has no wheezes.  Abdominal: Soft. He exhibits no distension. There is no tenderness.  Musculoskeletal: Normal range of motion.  Neurological: He is alert and oriented to person, place, and time.  Skin: Skin is warm and dry.  Psychiatric: He has a normal mood and affect. Judgment normal.  Nursing note and vitals reviewed.    ED Treatments / Results  Labs (all labs ordered are listed, but only abnormal results are displayed) Labs Reviewed - No data to display  EKG None  Radiology No results found.  Procedures Procedures (including critical care time)  Medications Ordered in ED Medications  predniSONE (DELTASONE) tablet 60 mg (has no administration in time range)     Initial Impression / Assessment and Plan /  ED Course  I have reviewed the triage vital signs and the nursing notes.  Pertinent labs & imaging results that were available during my care of the patient were reviewed by me and considered in my medical decision making (see chart for details).     Suspect viral URI.  Discharged home with steroids and Mucinex.  No indication for chest x-ray at this time.  Vital signs are stable.  No increased work of breathing.  No rhonchi.  Patient encouraged to follow-up with his primary care physician  Patient encouraged to return the emergency department for new or  worsening symptoms  Final Clinical Impressions(s) / ED Diagnoses   Final diagnoses:  Upper respiratory tract infection, unspecified type    ED Discharge Orders        Ordered    predniSONE (DELTASONE) 20 MG tablet  Daily     08/10/17 0415    dextromethorphan-guaiFENesin (MUCINEX DM) 30-600 MG 12hr tablet  2 times daily     08/10/17 0415       Azalia Bilisampos, Shonette Rhames, MD 08/10/17 [phone]

## 2017-10-23 ENCOUNTER — Emergency Department (HOSPITAL_BASED_OUTPATIENT_CLINIC_OR_DEPARTMENT_OTHER)
Admission: EM | Admit: 2017-10-23 | Discharge: 2017-10-23 | Disposition: A | Discharge status: Home / Self Care | Payer: BLUE CROSS/BLUE SHIELD | Attending: Emergency Medicine | Admitting: Emergency Medicine

## 2017-10-23 ENCOUNTER — Emergency Department (HOSPITAL_BASED_OUTPATIENT_CLINIC_OR_DEPARTMENT_OTHER): Payer: BLUE CROSS/BLUE SHIELD

## 2017-10-23 ENCOUNTER — Other Ambulatory Visit: Payer: Self-pay

## 2017-10-23 DIAGNOSIS — Z79899 Other long term (current) drug therapy: Secondary | ICD-10-CM | POA: Insufficient documentation

## 2017-10-23 DIAGNOSIS — F17200 Nicotine dependence, unspecified, uncomplicated: Secondary | ICD-10-CM | POA: Diagnosis not present

## 2017-10-23 DIAGNOSIS — R0602 Shortness of breath: Secondary | ICD-10-CM | POA: Diagnosis present

## 2017-10-23 DIAGNOSIS — J4541 Moderate persistent asthma with (acute) exacerbation: Secondary | ICD-10-CM | POA: Insufficient documentation

## 2017-10-23 MED ORDER — DEXAMETHASONE SODIUM PHOSPHATE 10 MG/ML IJ SOLN
10.0000 mg | Freq: Once | INTRAMUSCULAR | Status: AC
  Administered 2017-10-23: 10 mg via INTRAMUSCULAR
  Filled 2017-10-23: qty 1

## 2017-10-23 MED ORDER — ALBUTEROL SULFATE (2.5 MG/3ML) 0.083% IN NEBU
5.0000 mg | INHALATION_SOLUTION | Freq: Once | RESPIRATORY_TRACT | Status: AC
  Administered 2017-10-23: 5 mg via RESPIRATORY_TRACT
  Filled 2017-10-23: qty 6

## 2017-10-23 MED ORDER — ALBUTEROL SULFATE HFA 108 (90 BASE) MCG/ACT IN AERS: 2.0000 | INHALATION_SPRAY | RESPIRATORY_TRACT | 0 refills | Status: DC | PRN

## 2017-10-23 NOTE — ED Triage Notes (Signed)
Pt states he has asthma and has been having to use his rescue inhaler more than normal. States he has been getting minimal relief with its use. Pt has also had a non productive cough. Denies fevers or sick contacts

## 2017-10-23 NOTE — ED Notes (Signed)
Pt understood dc material. NAD noted. Script given at dc  

## 2017-10-23 NOTE — ED Notes (Signed)
Patient transported to X-ray 

## 2017-10-23 NOTE — ED Provider Notes (Signed)
[facility] EMERGENCY DEPARTMENT Provider Note   CSN: [id number] Arrival date & time: 10/23/17  0234     History   Chief Complaint Chief Complaint  Patient presents with  . Shortness of Breath    HPI Cameron Caldwell is a 22 y.o. male.  HPI  This is a 22 year old male with a history of achondroplasia and asthma who presents with shortness of breath.  Patient reports he feels like his asthma is acting up.  He has been using his inhaler as much as 3 times an hour.  No recent hospitalization.  Reports a nonproductive cough.  Denies fevers or known sick contact.  Denies leg swelling or chest pain.  Does report that he gets some relief with inhaler.  I have seen the patient multiple for similar symptoms and exacerbation in the past.  He has been hospitalized previously for his asthma but no intubations.  Past Medical History:  Diagnosis Date  . Achondroplasia   . Asthma     There are no active problems to display for this patient.   No past surgical history on file.      Home Medications    Prior to Admission medications   Medication Sig Start Date End Date Taking? Authorizing Provider  albuterol (PROVENTIL HFA;VENTOLIN HFA) 108 (90 Base) MCG/ACT inhaler Inhale 2 puffs into the lungs every 4 (four) hours as needed for wheezing or shortness of breath. 08/10/17   Azalia Bilis, MD  albuterol (PROVENTIL HFA;VENTOLIN HFA) 108 (90 Base) MCG/ACT inhaler Inhale 2 puffs into the lungs every 4 (four) hours as needed for wheezing or shortness of breath. 10/23/17   Brentley Landfair, Mayer Masker, MD  albuterol (PROVENTIL) (2.5 MG/3ML) 0.083% nebulizer solution Take 3 mLs (2.5 mg total) by nebulization every 6 (six) hours as needed for wheezing or shortness of breath. 08/10/17   Azalia Bilis, MD  dextromethorphan-guaiFENesin [facility] DM) 30-600 MG 12hr tablet Take 1 tablet by mouth 2 (two) times daily. 08/10/17   Azalia Bilis, MD    Family History No family history on file.  Social  History Social History   Tobacco Use  . Smoking status: Current Some Day Smoker  . Smokeless tobacco: Never Used  Substance Use Topics  . Alcohol use: Yes    Comment: occasional   . Drug use: No     Allergies   Patient has no known allergies.   Review of Systems Review of Systems  Constitutional: Negative for fever.  Respiratory: Positive for cough and shortness of breath.   Cardiovascular: Negative for chest pain.  Gastrointestinal: Negative for abdominal pain, nausea and vomiting.  All other systems reviewed and are negative.    Physical Exam Updated Vital Signs BP (!) 130/92   Pulse 76   Temp 97.8 F (36.6 C) (Oral)   Resp 20   Ht 1.372 m (4\' 6" )   Wt 57.2 kg   SpO2 99%   BMI 30.38 kg/m   Physical Exam  Constitutional: He is oriented to person, place, and time. He appears well-developed and well-nourished.  HENT:  Head: Normocephalic and atraumatic.  Neck: Neck supple.  Cardiovascular: Normal rate, regular rhythm and normal heart sounds.  No murmur heard. Pulmonary/Chest: Effort normal and breath sounds normal. No respiratory distress. He has no wheezes.  Pulmonary exam performed after DuoNeb, no significant expiratory wheezing noted although nursing staff noted expiratory wheezing on the left, speaking in full sentences, no acute distress  Abdominal: Soft. Bowel sounds are normal. There is no tenderness. There  is no rebound.  Musculoskeletal: He exhibits no edema.  Lymphadenopathy:    He has no cervical adenopathy.  Neurological: He is alert and oriented to person, place, and time.  Skin: Skin is warm and dry.  Psychiatric: He has a normal mood and affect.  Nursing note and vitals reviewed.    ED Treatments / Results  Labs (all labs ordered are listed, but only abnormal results are displayed) Labs Reviewed - No data to display  EKG None  Radiology Dg Chest 2 View  Result Date: 10/23/2017 CLINICAL DATA:  Shortness of breath. Patient with  history of asthma. EXAM: CHEST - 2 VIEW COMPARISON:  02/19/2017 FINDINGS: The cardiomediastinal contours are normal. Mild hyperinflation and bronchial thickening. Pulmonary vasculature is normal. No consolidation, pleural effusion, or pneumothorax. No acute osseous abnormalities are seen. IMPRESSION: Mild hyperinflation and bronchial thickening consistent with asthma or bronchitis. Electronically Signed   By: Narda Rutherford M.D.   On: 10/23/2017 03:32    Procedures Procedures (including critical care time)  Medications Ordered in ED Medications  albuterol (PROVENTIL) (2.5 MG/3ML) 0.083% nebulizer solution 5 mg (5 mg Nebulization Given 10/23/17 0251)  dexamethasone (DECADRON) injection 10 mg (10 mg Intramuscular Given 10/23/17 0303)     Initial Impression / Assessment and Plan / ED Course  I have reviewed the triage vital signs and the nursing notes.  Pertinent labs & imaging results that were available during my care of the patient were reviewed by me and considered in my medical decision making (see chart for details).     Presents with shortness of breath.  He is overall nontoxic-appearing.  No respiratory distress.  I have seen him multiple times for the same.  His O2 sats are 99%.  He is afebrile.  He has no wheezing on exam at this time and has good air movement.  Reported wheezing by nursing staff.  He has already received a DuoNeb.  IM decadron given.  Will obtain a chest x-ray given there is not significant wheezing on my exam.  Chest x-ray without pneumonia or pneumothorax.  Changes related to asthma.  Patient was able to ambulate and maintain pulse ox.  On recheck, he states he feels somewhat better.  He has clear breath sounds.  I discussed with patient that he needs to follow-up with his primary physician as he likely needs to be on a maintenance steroid inhaler.  Patient stated understanding.    After history, exam, and medical workup I feel the patient has been appropriately  medically screened and is safe for discharge home. Pertinent diagnoses were discussed with the patient. Patient was given return precautions.  Final Clinical Impressions(s) / ED Diagnoses   Final diagnoses:  Moderate persistent asthma with exacerbation    ED Discharge Orders         Ordered    albuterol (PROVENTIL HFA;VENTOLIN HFA) 108 (90 Base) MCG/ACT inhaler  Every 4 hours PRN     10/23/17 0344           Anniemae Haberkorn, Mayer Masker, MD 10/23/17 [phone]

## 2017-10-23 NOTE — Discharge Instructions (Addendum)
You were seen today for shortness of breath.  This may be related to your asthma.  It is very importantly follow-up with your primary physician.  You likely need a maintenance inhaler.

## 2017-10-23 NOTE — Progress Notes (Signed)
Patient ambulated around the department while on pulse ox.  Patients SPO2 was 95% and heart rate was 94 when he returned to his room.

## 2017-11-14 ENCOUNTER — Encounter (HOSPITAL_BASED_OUTPATIENT_CLINIC_OR_DEPARTMENT_OTHER): Payer: Self-pay

## 2017-11-14 ENCOUNTER — Emergency Department (HOSPITAL_BASED_OUTPATIENT_CLINIC_OR_DEPARTMENT_OTHER)
Admission: EM | Admit: 2017-11-14 | Discharge: 2017-11-14 | Disposition: A | Discharge status: Home / Self Care | Payer: BLUE CROSS/BLUE SHIELD | Attending: Emergency Medicine | Admitting: Emergency Medicine

## 2017-11-14 ENCOUNTER — Other Ambulatory Visit: Payer: Self-pay

## 2017-11-14 DIAGNOSIS — Y929 Unspecified place or not applicable: Secondary | ICD-10-CM | POA: Diagnosis not present

## 2017-11-14 DIAGNOSIS — Y939 Activity, unspecified: Secondary | ICD-10-CM | POA: Diagnosis not present

## 2017-11-14 DIAGNOSIS — X58XXXA Exposure to other specified factors, initial encounter: Secondary | ICD-10-CM | POA: Insufficient documentation

## 2017-11-14 DIAGNOSIS — Z87891 Personal history of nicotine dependence: Secondary | ICD-10-CM | POA: Insufficient documentation

## 2017-11-14 DIAGNOSIS — J45909 Unspecified asthma, uncomplicated: Secondary | ICD-10-CM | POA: Insufficient documentation

## 2017-11-14 DIAGNOSIS — Y999 Unspecified external cause status: Secondary | ICD-10-CM | POA: Insufficient documentation

## 2017-11-14 DIAGNOSIS — S3992XA Unspecified injury of lower back, initial encounter: Secondary | ICD-10-CM | POA: Diagnosis present

## 2017-11-14 DIAGNOSIS — S39012A Strain of muscle, fascia and tendon of lower back, initial encounter: Secondary | ICD-10-CM | POA: Diagnosis not present

## 2017-11-14 DIAGNOSIS — Q774 Achondroplasia: Secondary | ICD-10-CM | POA: Diagnosis not present

## 2017-11-14 DIAGNOSIS — Z79899 Other long term (current) drug therapy: Secondary | ICD-10-CM | POA: Diagnosis not present

## 2017-11-14 MED ORDER — CYCLOBENZAPRINE HCL 10 MG PO TABS: 10.0000 mg | ORAL_TABLET | Freq: Two times a day (BID) | ORAL | 0 refills | Status: DC | PRN

## 2017-11-14 MED ORDER — KETOROLAC TROMETHAMINE 30 MG/ML IJ SOLN
30.0000 mg | Freq: Once | INTRAMUSCULAR | Status: AC
  Administered 2017-11-14: 30 mg via INTRAMUSCULAR
  Filled 2017-11-14: qty 1

## 2017-11-14 MED ORDER — CELECOXIB 200 MG PO CAPS: 200.0000 mg | ORAL_CAPSULE | Freq: Two times a day (BID) | ORAL | 0 refills | Status: DC

## 2017-11-14 NOTE — ED Triage Notes (Signed)
C/o lower back spasms x today-NAD-steady gait

## 2017-11-14 NOTE — ED Provider Notes (Signed)
[facility] EMERGENCY DEPARTMENT Provider Note   CSN: [id number] Arrival date & time: 11/14/17  1633     History   Chief Complaint Chief Complaint  Patient presents with  . Back Pain    HPI Cameron Caldwell is a 22 y.o. male.  Pt presents to the ED today with left lower back spasms.  He said it started at work.  He has a little pain radiating down his into his left buttock.  The pt does have achondroplasia, but does not usually have back pain.  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     Past Medical History:  Diagnosis Date  . Achondroplasia   . Asthma     There are no active problems to display for this patient.   History reviewed. No pertinent surgical history.      Home Medications    Prior to Admission medications   Medication Sig Start Date End Date Taking? Authorizing Provider  albuterol (PROVENTIL HFA;VENTOLIN HFA) 108 (90 Base) MCG/ACT inhaler Inhale 2 puffs into the lungs every 4 (four) hours as needed for wheezing or shortness of breath. 08/10/17   Azalia Bilis, MD  albuterol (PROVENTIL HFA;VENTOLIN HFA) 108 (90 Base) MCG/ACT inhaler Inhale 2 puffs into the lungs every 4 (four) hours as needed for wheezing or shortness of breath. 10/23/17   Horton, Mayer Masker, MD  albuterol (PROVENTIL) (2.5 MG/3ML) 0.083% nebulizer solution Take 3 mLs (2.5 mg total) by nebulization every 6 (six) hours as needed for wheezing or shortness of breath. 08/10/17   Azalia Bilis, MD  celecoxib (CELEBREX) 200 MG capsule Take 1 capsule (200 mg total) by mouth 2 (two) times daily. 11/14/17   Jacalyn Lefevre, MD  cyclobenzaprine (FLEXERIL) 10 MG tablet Take 1 tablet (10 mg total) by mouth 2 (two) times daily as needed for muscle spasms. 11/14/17   Jacalyn Lefevre, MD  dextromethorphan-guaiFENesin [facility] DM) 30-600 MG 12hr tablet Take 1 tablet by mouth 2 (two) times daily. 08/10/17   Azalia Bilis, MD    Family History No family history on file.  Social History Social  History   Tobacco Use  . Smoking status: Former Games developer  . Smokeless tobacco: Never Used  Substance Use Topics  . Alcohol use: Yes    Comment: occasional   . Drug use: No     Allergies   Patient has no known allergies.   Review of Systems Review of Systems  Musculoskeletal: Positive for back pain.  All other systems reviewed and are negative.    Physical Exam Updated Vital Signs BP 132/85 (BP Location: Left Arm)   Pulse 80   Temp 98.8 F (37.1 C) (Oral)   Resp 18   Wt 58.5 kg   SpO2 98%   BMI 31.10 kg/m   Physical Exam  Constitutional: He is oriented to person, place, and time. He appears well-developed and well-nourished.  HENT:  Head: Normocephalic and atraumatic.  Right Ear: External ear normal.  Left Ear: External ear normal.  Nose: Nose normal.  Mouth/Throat: Oropharynx is clear and moist.  Eyes: Pupils are equal, round, and reactive to light. Conjunctivae and EOM are normal.  Neck: Normal range of motion. Neck supple.  Cardiovascular: Normal rate, regular rhythm, normal heart sounds and intact distal pulses.  Pulmonary/Chest: Effort normal and breath sounds normal.  Abdominal: Soft. Bowel sounds are normal.  Musculoskeletal:       Arms: Neurological: He is alert and oriented to person, place, and time.  Skin:  Skin is warm. Capillary refill takes less than 2 seconds.  Psychiatric: He has a normal mood and affect. His behavior is normal. Judgment and thought content normal.  Nursing note and vitals reviewed.    ED Treatments / Results  Labs (all labs ordered are listed, but only abnormal results are displayed) Labs Reviewed - No data to display  EKG None  Radiology No results found.  Procedures Procedures (including critical care time)  Medications Ordered in ED Medications  ketorolac (TORADOL) 30 MG/ML injection 30 mg (30 mg Intramuscular Given 11/14/17 1725)     Initial Impression / Assessment and Plan / ED Course  I have reviewed the  triage vital signs and the nursing notes.  Pertinent labs & imaging results that were available during my care of the patient were reviewed by me and considered in my medical decision making (see chart for details).    Pt given a dose of toradol here in ED.  He is given rx for celebrex and flexeril.  He knows to return if worse and to f/u with pcp.  Final Clinical Impressions(s) / ED Diagnoses   Final diagnoses:  Strain of lumbar region, initial encounter    ED Discharge Orders         Ordered    celecoxib (CELEBREX) 200 MG capsule  2 times daily     11/14/17 1711    cyclobenzaprine (FLEXERIL) 10 MG tablet  2 times daily PRN     11/14/17 1711           Jacalyn Lefevre, MD 11/15/17 1529

## 2018-01-02 ENCOUNTER — Other Ambulatory Visit: Payer: Self-pay

## 2018-01-02 ENCOUNTER — Emergency Department (HOSPITAL_BASED_OUTPATIENT_CLINIC_OR_DEPARTMENT_OTHER)
Admission: EM | Admit: 2018-01-02 | Discharge: 2018-01-02 | Disposition: A | Discharge status: Home / Self Care | Payer: BLUE CROSS/BLUE SHIELD | Attending: Emergency Medicine | Admitting: Emergency Medicine

## 2018-01-02 ENCOUNTER — Encounter (HOSPITAL_BASED_OUTPATIENT_CLINIC_OR_DEPARTMENT_OTHER): Payer: Self-pay | Admitting: *Deleted

## 2018-01-02 DIAGNOSIS — J45909 Unspecified asthma, uncomplicated: Secondary | ICD-10-CM | POA: Diagnosis not present

## 2018-01-02 DIAGNOSIS — Z79899 Other long term (current) drug therapy: Secondary | ICD-10-CM | POA: Insufficient documentation

## 2018-01-02 DIAGNOSIS — M545 Low back pain: Secondary | ICD-10-CM | POA: Diagnosis present

## 2018-01-02 DIAGNOSIS — M5432 Sciatica, left side: Secondary | ICD-10-CM

## 2018-01-02 DIAGNOSIS — Z87891 Personal history of nicotine dependence: Secondary | ICD-10-CM | POA: Diagnosis not present

## 2018-01-02 LAB — URINALYSIS, ROUTINE W REFLEX MICROSCOPIC
Bilirubin Urine: NEGATIVE
Glucose, UA: NEGATIVE mg/dL
HGB URINE DIPSTICK: NEGATIVE
KETONES UR: NEGATIVE mg/dL
Leukocytes, UA: NEGATIVE
Nitrite: NEGATIVE
PROTEIN: NEGATIVE mg/dL
Specific Gravity, Urine: >1.03 — ABNORMAL HIGH (ref 1.005–1.030)
pH: 6 (ref 5.0–8.0)

## 2018-01-02 MED ORDER — KETOROLAC TROMETHAMINE 60 MG/2ML IM SOLN
60.0000 mg | Freq: Once | INTRAMUSCULAR | Status: AC
  Administered 2018-01-02: 60 mg via INTRAMUSCULAR
  Filled 2018-01-02: qty 2

## 2018-01-02 MED ORDER — CYCLOBENZAPRINE HCL 10 MG PO TABS: 10.0000 mg | ORAL_TABLET | Freq: Three times a day (TID) | ORAL | 0 refills | Status: DC | PRN

## 2018-01-02 NOTE — Discharge Instructions (Signed)
Take 600 mg of motrin every 8 hours for next 5 days. °

## 2018-01-02 NOTE — ED Triage Notes (Signed)
Lower back pain for several months. No injury. Denies dysuria. He is ambulatory.

## 2018-01-02 NOTE — ED Provider Notes (Signed)
[facility] EMERGENCY DEPARTMENT Provider Note   CSN: [id number] Arrival date & time: 01/02/18  1615     History   Chief Complaint Chief Complaint  Patient presents with  . Back Pain    HPI Cameron Caldwell is a 22 y.o. male.  The history is provided by the patient.  Back Pain   This is a recurrent problem. The current episode started yesterday. The problem occurs constantly. The problem has not changed since onset.The pain is associated with no known injury. The pain is present in the lumbar spine. The quality of the pain is described as aching. The pain is at a severity of 3/10. The pain is mild. The symptoms are aggravated by bending, twisting and certain positions. The pain is the same all the time. Pertinent negatives include no chest pain, no fever, no numbness, no abdominal pain, no abdominal swelling, no bowel incontinence, no perianal numbness, no bladder incontinence, no dysuria, no paresthesias, no paresis, no tingling and no weakness. He has tried analgesics for the symptoms. The treatment provided mild relief.    Past Medical History:  Diagnosis Date  . Achondroplasia   . Asthma     There are no active problems to display for this patient.   History reviewed. No pertinent surgical history.      Home Medications    Prior to Admission medications   Medication Sig Start Date End Date Taking? Authorizing Provider  albuterol (PROVENTIL HFA;VENTOLIN HFA) 108 (90 Base) MCG/ACT inhaler Inhale 2 puffs into the lungs every 4 (four) hours as needed for wheezing or shortness of breath. 08/10/17  Yes Azalia Bilis, MD  albuterol (PROVENTIL HFA;VENTOLIN HFA) 108 (90 Base) MCG/ACT inhaler Inhale 2 puffs into the lungs every 4 (four) hours as needed for wheezing or shortness of breath. 10/23/17   Horton, Mayer Masker, MD  albuterol (PROVENTIL) (2.5 MG/3ML) 0.083% nebulizer solution Take 3 mLs (2.5 mg total) by nebulization every 6 (six) hours as needed for wheezing or  shortness of breath. 08/10/17   Azalia Bilis, MD  celecoxib (CELEBREX) 200 MG capsule Take 1 capsule (200 mg total) by mouth 2 (two) times daily. 11/14/17   Jacalyn Lefevre, MD  cyclobenzaprine (FLEXERIL) 10 MG tablet Take 1 tablet (10 mg total) by mouth 3 (three) times daily as needed for up to 12 doses for muscle spasms. 01/02/18   Wyona Neils, DO  dextromethorphan-guaiFENesin (MUCINEX DM) 30-600 MG 12hr tablet Take 1 tablet by mouth 2 (two) times daily. 08/10/17   Azalia Bilis, MD    Family History No family history on file.  Social History Social History   Tobacco Use  . Smoking status: Former Games developer  . Smokeless tobacco: Never Used  Substance Use Topics  . Alcohol use: Yes    Comment: occasional   . Drug use: No     Allergies   Patient has no known allergies.   Review of Systems Review of Systems  Constitutional: Negative for chills and fever.  HENT: Negative for ear pain and sore throat.   Eyes: Negative for pain and visual disturbance.  Respiratory: Negative for cough and shortness of breath.   Cardiovascular: Negative for chest pain and palpitations.  Gastrointestinal: Negative for abdominal pain, bowel incontinence and vomiting.  Genitourinary: Negative for bladder incontinence, dysuria and hematuria.  Musculoskeletal: Positive for back pain. Negative for arthralgias.  Skin: Negative for color change and rash.  Neurological: Negative for tingling, seizures, syncope, weakness, numbness and paresthesias.  All other systems reviewed and  are negative.    Physical Exam Updated Vital Signs BP 135/88 (BP Location: Left Arm)   Pulse 84   Temp 98.5 F (36.9 C) (Oral)   Resp 16   Ht 4\' 7"  (1.397 m)   Wt 56.7 kg   SpO2 100%   BMI 29.05 kg/m   Physical Exam Vitals signs and nursing note reviewed.  Constitutional:      Appearance: He is well-developed.  HENT:     Head: Normocephalic and atraumatic.     Nose: Nose normal.     Mouth/Throat:     Mouth:  Mucous membranes are moist.  Eyes:     Extraocular Movements: Extraocular movements intact.     Conjunctiva/sclera: Conjunctivae normal.     Pupils: Pupils are equal, round, and reactive to light.  Neck:     Musculoskeletal: Neck supple.  Cardiovascular:     Rate and Rhythm: Normal rate and regular rhythm.     Pulses: Normal pulses.     Heart sounds: Normal heart sounds. No murmur.  Pulmonary:     Effort: Pulmonary effort is normal. No respiratory distress.     Breath sounds: Normal breath sounds.  Abdominal:     Palpations: Abdomen is soft.     Tenderness: There is no abdominal tenderness.  Musculoskeletal: Normal range of motion.        General: No swelling, tenderness or deformity.     Right lower leg: No edema.     Left lower leg: No edema.  Skin:    General: Skin is warm and dry.     Capillary Refill: Capillary refill takes less than 2 seconds.  Neurological:     General: No focal deficit present.     Mental Status: He is alert and oriented to person, place, and time.     Cranial Nerves: No cranial nerve deficit.     Sensory: No sensory deficit.     Motor: No weakness.     Coordination: Coordination normal.     Gait: Gait normal.     Comments: 5+/5 strength throughout, normal sensation   Psychiatric:        Mood and Affect: Mood normal.      ED Treatments / Results  Labs (all labs ordered are listed, but only abnormal results are displayed) Labs Reviewed  URINALYSIS, ROUTINE W REFLEX MICROSCOPIC - Abnormal; Notable for the following components:      Result Value   Specific Gravity, Urine >1.030 (*)    All other components within normal limits    EKG None  Radiology No results found.  Procedures Procedures (including critical care time)  Medications Ordered in ED Medications  ketorolac (TORADOL) injection 60 mg (60 mg Intramuscular Given 01/02/18 1951)     Initial Impression / Assessment and Plan / ED Course  I have reviewed the triage vital signs  and the nursing notes.  Pertinent labs & imaging results that were available during my care of the patient were reviewed by me and considered in my medical decision making (see chart for details).     Cameron Caldwell is a 22 year old male with history of asthma, achondroplasia who presents to the ED with low back pain.  Patient with normal vitals.  No fever.  Patient states has history of the same pain.  Works Youth worker job.  Pain is in bilateral lumbar paraspinal area.  No midline spinal pain.  No concern for cord compression.  Neurologically intact.  Patient likely with muscle spasm, muscle  strain.  Patient denies any urinary retention, loss of bowel or bladder.  Denies any IV drug use.  No concern for epidural abscess.  Patient given Toradol injection.  Recommend Motrin and Tylenol at home.  Given prescription for Flexeril.  Discharged in ED good condition.  Given return precautions.  This chart was dictated using voice recognition software.  Despite best efforts to proofread,  errors can occur which can change the documentation meaning.   Final Clinical Impressions(s) / ED Diagnoses   Final diagnoses:  Sciatica of left side    ED Discharge Orders         Ordered    cyclobenzaprine (FLEXERIL) 10 MG tablet  3 times daily PRN     01/02/18 1938           Virgina NorfolkCuratolo, Amira Podolak, DO 01/03/18 0102

## 2018-01-02 NOTE — ED Notes (Signed)
ED Provider at bedside. 

## 2018-01-10 ENCOUNTER — Emergency Department (HOSPITAL_BASED_OUTPATIENT_CLINIC_OR_DEPARTMENT_OTHER)
Admission: EM | Admit: 2018-01-10 | Discharge: 2018-01-10 | Disposition: A | Discharge status: Home / Self Care | Payer: BLUE CROSS/BLUE SHIELD | Attending: Emergency Medicine | Admitting: Emergency Medicine

## 2018-01-10 ENCOUNTER — Emergency Department (HOSPITAL_BASED_OUTPATIENT_CLINIC_OR_DEPARTMENT_OTHER): Payer: BLUE CROSS/BLUE SHIELD

## 2018-01-10 ENCOUNTER — Other Ambulatory Visit: Payer: Self-pay

## 2018-01-10 ENCOUNTER — Encounter (HOSPITAL_BASED_OUTPATIENT_CLINIC_OR_DEPARTMENT_OTHER): Payer: Self-pay | Admitting: Emergency Medicine

## 2018-01-10 DIAGNOSIS — Z79899 Other long term (current) drug therapy: Secondary | ICD-10-CM | POA: Diagnosis not present

## 2018-01-10 DIAGNOSIS — Z87891 Personal history of nicotine dependence: Secondary | ICD-10-CM | POA: Insufficient documentation

## 2018-01-10 DIAGNOSIS — J45909 Unspecified asthma, uncomplicated: Secondary | ICD-10-CM | POA: Insufficient documentation

## 2018-01-10 DIAGNOSIS — J189 Pneumonia, unspecified organism: Secondary | ICD-10-CM | POA: Diagnosis not present

## 2018-01-10 DIAGNOSIS — R0781 Pleurodynia: Secondary | ICD-10-CM | POA: Diagnosis present

## 2018-01-10 LAB — CBC WITH DIFFERENTIAL/PLATELET
Abs Immature Granulocytes: 0.02 10*3/uL (ref 0.00–0.07)
Basophils Absolute: 0 10*3/uL (ref 0.0–0.1)
Basophils Relative: 1 %
Eosinophils Absolute: 0.1 10*3/uL (ref 0.0–0.5)
Eosinophils Relative: 1 %
HCT: 41.4 % (ref 39.0–52.0)
Hemoglobin: 13.6 g/dL (ref 13.0–17.0)
Immature Granulocytes: 0 %
Lymphocytes Relative: 29 %
Lymphs Abs: 2.4 10*3/uL (ref 0.7–4.0)
MCH: 28.3 pg (ref 26.0–34.0)
MCHC: 32.9 g/dL (ref 30.0–36.0)
MCV: 86.3 fL (ref 80.0–100.0)
Monocytes Absolute: 0.8 10*3/uL (ref 0.1–1.0)
Monocytes Relative: 9 %
Neutro Abs: 4.8 10*3/uL (ref 1.7–7.7)
Neutrophils Relative %: 60 %
Platelets: 205 10*3/uL (ref 150–400)
RBC: 4.8 MIL/uL (ref 4.22–5.81)
RDW: 12 % (ref 11.5–15.5)
WBC: 8.2 10*3/uL (ref 4.0–10.5)
nRBC: 0 % (ref 0.0–0.2)

## 2018-01-10 LAB — COMPREHENSIVE METABOLIC PANEL
ALT: 24 U/L (ref 0–44)
AST: 21 U/L (ref 15–41)
Albumin: 4.2 g/dL (ref 3.5–5.0)
Alkaline Phosphatase: 50 U/L (ref 38–126)
Anion gap: 7 (ref 5–15)
BUN: 11 mg/dL (ref 6–20)
CO2: 25 mmol/L (ref 22–32)
Calcium: 9.3 mg/dL (ref 8.9–10.3)
Chloride: 106 mmol/L (ref 98–111)
Creatinine, Ser: 0.7 mg/dL (ref 0.61–1.24)
GFR calc Af Amer: >60 mL/min (ref >60)
GFR calc non Af Amer: >60 mL/min (ref >60)
Glucose, Bld: 88 mg/dL (ref 70–99)
Potassium: 3.7 mmol/L (ref 3.5–5.1)
Sodium: 138 mmol/L (ref 135–145)
Total Bilirubin: 0.6 mg/dL (ref 0.3–1.2)
Total Protein: 7.2 g/dL (ref 6.5–8.1)

## 2018-01-10 LAB — TROPONIN I: Troponin I: <0.03 ng/mL (ref <0.03)

## 2018-01-10 LAB — D-DIMER, QUANTITATIVE: D-Dimer, Quant: <0.27 ug/mL-FEU (ref 0.00–0.50)

## 2018-01-10 MED ORDER — IBUPROFEN 600 MG PO TABS: 600.0000 mg | ORAL_TABLET | Freq: Four times a day (QID) | ORAL | 0 refills | Status: DC | PRN

## 2018-01-10 MED ORDER — ACETAMINOPHEN 500 MG PO TABS: 500.0000 mg | ORAL_TABLET | Freq: Four times a day (QID) | ORAL | 0 refills | Status: DC | PRN

## 2018-01-10 MED ORDER — ALBUTEROL SULFATE HFA 108 (90 BASE) MCG/ACT IN AERS: 2.0000 | INHALATION_SPRAY | RESPIRATORY_TRACT | 0 refills | Status: DC | PRN

## 2018-01-10 MED ORDER — ALBUTEROL SULFATE (2.5 MG/3ML) 0.083% IN NEBU: 2.5000 mg | INHALATION_SOLUTION | Freq: Four times a day (QID) | RESPIRATORY_TRACT | 0 refills | Status: DC | PRN

## 2018-01-10 MED ORDER — DOXYCYCLINE HYCLATE 100 MG PO CAPS: 100.0000 mg | ORAL_CAPSULE | Freq: Two times a day (BID) | ORAL | 0 refills | Status: DC

## 2018-01-10 NOTE — ED Notes (Signed)
NAD at this time. Pt is stable and going home.  

## 2018-01-10 NOTE — ED Provider Notes (Signed)
[facility] EMERGENCY DEPARTMENT Provider Note   CSN: [id number] Arrival date & time: 01/10/18  1223     History   Chief Complaint Chief Complaint  Patient presents with  . Chest Pain    HPI Cameron Caldwell is a 22 y.o. male with history of achondroplasia, asthma who presents with a 1 day history of right-sided pleuritic chest pain.  He describes a jabbing sensation when he takes a deep breath.  He denies any shortness of breath.  Patient denies any recent heavy lifting.  He reports he is actually been taking time off work because of back pain.  He does note that he drinks a lot of alcohol over the weekend and vomited several times.  He denies any abdominal pain or current nausea or vomiting.  He reports the pain is only present with a deep breath and shoots up toward his shoulder on the right.  He denies any recent long trips, surgeries, known cancer, new leg pain or swelling, history of blood clots.  Patient has been taking his asthma medications, but has not taken any other medications at home for this.  He denies any drug use, specifically cocaine.   Chest Pain   Associated symptoms include back pain. Pertinent negatives include no abdominal pain, no cough, no fever, no headaches, no nausea, no shortness of breath and no vomiting.    Past Medical History:  Diagnosis Date  . Achondroplasia   . Asthma     There are no active problems to display for this patient.   History reviewed. No pertinent surgical history.      Home Medications    Prior to Admission medications   Medication Sig Start Date End Date Taking? Authorizing Provider  acetaminophen (TYLENOL) 500 MG tablet Take 1 tablet (500 mg total) by mouth every 6 (six) hours as needed. 01/10/18   Braxtyn Dorff, Waylan Boga, PA-C  albuterol (PROVENTIL HFA;VENTOLIN HFA) 108 (90 Base) MCG/ACT inhaler Inhale 2 puffs into the lungs every 4 (four) hours as needed for wheezing or shortness of breath. 01/10/18   Garcia Dalzell,  Waylan Boga, PA-C  albuterol (PROVENTIL) (2.5 MG/3ML) 0.083% nebulizer solution Take 3 mLs (2.5 mg total) by nebulization every 6 (six) hours as needed for wheezing or shortness of breath. 01/10/18   Emi Holes, PA-C  celecoxib (CELEBREX) 200 MG capsule Take 1 capsule (200 mg total) by mouth 2 (two) times daily. 11/14/17   Jacalyn Lefevre, MD  cyclobenzaprine (FLEXERIL) 10 MG tablet Take 1 tablet (10 mg total) by mouth 3 (three) times daily as needed for up to 12 doses for muscle spasms. 01/02/18   Curatolo, Adam, DO  dextromethorphan-guaiFENesin (MUCINEX DM) 30-600 MG 12hr tablet Take 1 tablet by mouth 2 (two) times daily. 08/10/17   Azalia Bilis, MD  doxycycline (VIBRAMYCIN) 100 MG capsule Take 1 capsule (100 mg total) by mouth 2 (two) times daily. 01/10/18   Javanni Maring, Waylan Boga, PA-C  ibuprofen (ADVIL,MOTRIN) 600 MG tablet Take 1 tablet (600 mg total) by mouth every 6 (six) hours as needed. 01/10/18   Dashley Monts, Waylan Boga, PA-C  QVAR REDIHALER 40 MCG/ACT inhaler INHALE 1 PUFF PO BID 10/28/17   [provider]    Family History History reviewed. No pertinent family history.  Social History Social History   Tobacco Use  . Smoking status: Former Games developer  . Smokeless tobacco: Never Used  Substance Use Topics  . Alcohol use: Yes    Comment: occasional   . Drug use: No  Allergies   Patient has no known allergies.   Review of Systems Review of Systems  Constitutional: Negative for chills and fever.  HENT: Negative for facial swelling and sore throat.   Respiratory: Negative for cough and shortness of breath.   Cardiovascular: Positive for chest pain.  Gastrointestinal: Negative for abdominal pain, nausea and vomiting.  Genitourinary: Negative for dysuria.  Musculoskeletal: Positive for back pain.  Skin: Negative for rash and wound.  Neurological: Negative for headaches.  Psychiatric/Behavioral: The patient is not nervous/anxious.      Physical Exam Updated Vital  Signs BP 137/82   Pulse 81   Temp 98.2 F (36.8 C) (Oral)   Resp 18   Ht 4\' 7"  (1.397 m)   Wt 58.1 kg   SpO2 99%   BMI 29.75 kg/m   Physical Exam Vitals signs and nursing note reviewed.  Constitutional:      General: He is not in acute distress.    Appearance: He is well-developed. He is not diaphoretic.  HENT:     Head: Normocephalic and atraumatic.     Mouth/Throat:     Pharynx: No oropharyngeal exudate.  Eyes:     General: No scleral icterus.       Right eye: No discharge.        Left eye: No discharge.     Conjunctiva/sclera: Conjunctivae normal.     Pupils: Pupils are equal, round, and reactive to light.  Neck:     Musculoskeletal: Normal range of motion and neck supple.     Thyroid: No thyromegaly.  Cardiovascular:     Rate and Rhythm: Normal rate and regular rhythm.     Heart sounds: Normal heart sounds. No murmur. No friction rub. No gallop.      Comments: Tachycardic on arrival, patient reports he did use his nebulizer PTA Pulmonary:     Effort: Pulmonary effort is normal. No respiratory distress.     Breath sounds: Normal breath sounds. No stridor. No wheezing or rales.  Chest:     Chest wall: No tenderness.  Abdominal:     General: Bowel sounds are normal. There is no distension.     Palpations: Abdomen is soft.     Tenderness: There is no abdominal tenderness. There is no guarding or rebound.  Lymphadenopathy:     Cervical: No cervical adenopathy.  Skin:    General: Skin is warm and dry.     Coloration: Skin is not pale.     Findings: No rash.  Neurological:     Mental Status: He is alert.     Coordination: Coordination normal.      ED Treatments / Results  Labs (all labs ordered are listed, but only abnormal results are displayed) Labs Reviewed  COMPREHENSIVE METABOLIC PANEL  CBC WITH DIFFERENTIAL/PLATELET  D-DIMER, QUANTITATIVE (NOT [facility])  TROPONIN I    EKG EKG Interpretation  Date/Time:  Tuesday January 10 2018 12:32:48  EST Ventricular Rate:  98 PR Interval:  142 QRS Duration: 96 QT Interval:  330 QTC Calculation: 421 R Axis:   92 Text Interpretation:  Normal sinus rhythm Rightward axis Borderline ECG No significant change since last tracing Confirmed by Doug SouJacubowitz, Sam [phone]([id number]) on 01/10/2018 12:53:02 PM   Radiology Dg Chest 2 View  Result Date: 01/10/2018 CLINICAL DATA:  Initial evaluation for acute right lower pleuritic chest pain. EXAM: CHEST - 2 VIEW COMPARISON:  Prior radiograph from 10/23/2017. FINDINGS: Cardiac and mediastinal silhouettes are stable in size and contour, and remain within  normal limits. Lungs are mildly hyperinflated. Mild hazy opacities within the mid lung bases bilaterally, right slightly greater than left, could reflect atelectasis or small infiltrates. No edema or effusion. No pneumothorax. No acute osseous abnormality. IMPRESSION: 1. Mild hazy bibasilar opacities, right greater than left, which could reflect atelectasis or small infiltrates. 2. Hyperinflation. Electronically Signed   By: Rise MuBenjamin  McClintock M.D.   On: 01/10/2018 15:34    Procedures Procedures (including critical care time)  Medications Ordered in ED Medications - No data to display   Initial Impression / Assessment and Plan / ED Course  I have reviewed the triage vital signs and the nursing notes.  Pertinent labs & imaging results that were available during my care of the patient were reviewed by me and considered in my medical decision making (see chart for details).     Patient presenting with probable atypical pneumonia.  Patient with history of asthma.  CBC, CMP, troponin, d-dimer negative.  EKG shows NSR and no significant change since last tracing.  Chest x-ray shows mild hazy bibasilar opacities, right greater than left, which could reflect atelectasis or small infiltrates; hyperinflation.  Considering patient's symptoms, will treat for atypical pneumonia with doxycycline.  Continue albuterol as  needed.  Advised ibuprofen and Tylenol as needed for pain as well.  Return precautions discussed.  Patient understands and agrees with plan.  Patient vitals stable throughout ED course and discharged in satisfactory condition.  Final Clinical Impressions(s) / ED Diagnoses   Final diagnoses:  Community acquired pneumonia, unspecified laterality    ED Discharge Orders         Ordered    doxycycline (VIBRAMYCIN) 100 MG capsule  2 times daily     01/10/18 1614    ibuprofen (ADVIL,MOTRIN) 600 MG tablet  Every 6 hours PRN     01/10/18 1614    acetaminophen (TYLENOL) 500 MG tablet  Every 6 hours PRN     01/10/18 1614    albuterol (PROVENTIL HFA;VENTOLIN HFA) 108 (90 Base) MCG/ACT inhaler  Every 4 hours PRN     01/10/18 1614    albuterol (PROVENTIL) (2.5 MG/3ML) 0.083% nebulizer solution  Every 6 hours PRN     01/10/18 [address]           Haden Suder M, PA-C 01/11/18 1019    Gwyneth SproutPlunkett, [place], MD 01/12/18 2207

## 2018-01-10 NOTE — ED Triage Notes (Signed)
Patient states that he has pain to his right lower chest when he takes a deep breath  - patient states that this has happened since yesterday

## 2018-01-10 NOTE — Discharge Instructions (Addendum)
Take doxycycline until completed.  You can alternate with ibuprofen and Tylenol as prescribed, as needed for your pain.  Use albuterol as prescribed, as needed for shortness of breath or wheezing.  Please return to the emergency department if you develop any new or worsening symptoms.  Please follow-up with your doctor if symptoms are not improving.

## 2018-03-04 ENCOUNTER — Encounter (HOSPITAL_BASED_OUTPATIENT_CLINIC_OR_DEPARTMENT_OTHER): Payer: Self-pay | Admitting: Adult Health

## 2018-03-04 ENCOUNTER — Other Ambulatory Visit: Payer: Self-pay

## 2018-03-04 ENCOUNTER — Emergency Department (HOSPITAL_BASED_OUTPATIENT_CLINIC_OR_DEPARTMENT_OTHER)
Admission: EM | Admit: 2018-03-04 | Discharge: 2018-03-04 | Disposition: A | Discharge status: Home / Self Care | Payer: BLUE CROSS/BLUE SHIELD | Attending: Emergency Medicine | Admitting: Emergency Medicine

## 2018-03-04 DIAGNOSIS — Z79899 Other long term (current) drug therapy: Secondary | ICD-10-CM | POA: Diagnosis not present

## 2018-03-04 DIAGNOSIS — Z87891 Personal history of nicotine dependence: Secondary | ICD-10-CM | POA: Diagnosis not present

## 2018-03-04 DIAGNOSIS — J069 Acute upper respiratory infection, unspecified: Secondary | ICD-10-CM

## 2018-03-04 DIAGNOSIS — J45909 Unspecified asthma, uncomplicated: Secondary | ICD-10-CM | POA: Diagnosis not present

## 2018-03-04 DIAGNOSIS — B9789 Other viral agents as the cause of diseases classified elsewhere: Secondary | ICD-10-CM

## 2018-03-04 DIAGNOSIS — R05 Cough: Secondary | ICD-10-CM | POA: Diagnosis present

## 2018-03-04 MED ORDER — PREDNISONE 20 MG PO TABS: 40.0000 mg | ORAL_TABLET | Freq: Every day | ORAL | 0 refills | Status: DC

## 2018-03-04 NOTE — ED Triage Notes (Signed)
Presents with 2 days of congestion, cough, chills, generalized body aches and fatigue.

## 2018-03-04 NOTE — Discharge Instructions (Signed)
Please read and follow all provided instructions.  Your diagnoses today include:  1. Viral upper respiratory tract infection with cough    You appear to have an upper respiratory infection (URI). An upper respiratory tract infection, or cold, is a viral infection of the air passages leading to the lungs. It should improve gradually after 5-7 days. You may have a lingering cough that lasts for 2- 4 weeks after the infection.  Tests performed today include:  Vital signs. See below for your results today.   Medications prescribed:   Prednisone - steroid medicine   It is best to take this medication in the morning to prevent sleeping problems. If you are diabetic, monitor your blood sugar closely and stop taking Prednisone if blood sugar is over 300. Take with food to prevent stomach upset.   Take any prescribed medications only as directed. Treatment for your infection is aimed at treating the symptoms. There are no medications, such as antibiotics, that will cure your infection.   Home care instructions:  Follow any educational materials contained in this packet.   Your illness is contagious and can be spread to others, especially during the first 3 or 4 days. It cannot be cured by antibiotics or other medicines. Take basic precautions such as washing your hands often, covering your mouth when you cough or sneeze, and avoiding public places where you could spread your illness to others.   Please continue drinking plenty of fluids.  Use over-the-counter medicines as needed as directed on packaging for symptom relief.  You may also use ibuprofen or tylenol as directed on packaging for pain or fever.  Do not take multiple medicines containing Tylenol or acetaminophen to avoid taking too much of this medication.  Follow-up instructions: Please follow-up with your primary care provider in the next 3 days for further evaluation of your symptoms if you are not feeling better.   Return  instructions:   Please return to the Emergency Department if you experience worsening symptoms.   RETURN IMMEDIATELY IF you develop shortness of breath, confusion or altered mental status, a new rash, become dizzy, faint, or poorly responsive, or are unable to be cared for at home.  Please return if you have persistent vomiting and cannot keep down fluids or develop a fever that is not controlled by tylenol or motrin.    Please return if you have any other emergent concerns.  Additional Information:  Your vital signs today were: BP 133/84 (BP Location: Left Arm)    Pulse 74    Temp 98.2 F (36.8 C) (Oral)    Resp 18    Ht 4\' 7"  (1.397 m)    Wt 59 kg    SpO2 100%    BMI 30.21 kg/m  If your blood pressure (BP) was elevated above 135/85 this visit, please have this repeated by your doctor within one month. --------------

## 2018-03-04 NOTE — ED Provider Notes (Signed)
[facility] EMERGENCY DEPARTMENT Provider Note   CSN: [id number] Arrival date & time: 03/04/18  1217     History   Chief Complaint Chief Complaint  Patient presents with  . Influenza    HPI Cameron Caldwell is a 23 y.o. male.  Patient with history of asthma presents the emergency department today with 2 to 3-day history of cough, body aches, chills, nasal congestion.  No fever, ear pain, sore throat.  No chest pain or shortness of breath.  Patient has been using his albuterol inhaler more frequently at home.  States that sometimes his asthma does flareup when he gets sick.  No nausea, vomiting, abdominal pain or diarrhea.  No known sick contacts.  Onset of symptoms acute.  Course is constant.     Past Medical History:  Diagnosis Date  . Achondroplasia   . Asthma     There are no active problems to display for this patient.   History reviewed. No pertinent surgical history.      Home Medications    Prior to Admission medications   Medication Sig Start Date End Date Taking? Authorizing Provider  acetaminophen (TYLENOL) 500 MG tablet Take 1 tablet (500 mg total) by mouth every 6 (six) hours as needed. 01/10/18   Law, Waylan Boga, PA-C  albuterol (PROVENTIL HFA;VENTOLIN HFA) 108 (90 Base) MCG/ACT inhaler Inhale 2 puffs into the lungs every 4 (four) hours as needed for wheezing or shortness of breath. 01/10/18   Law, Waylan Boga, PA-C  albuterol (PROVENTIL) (2.5 MG/3ML) 0.083% nebulizer solution Take 3 mLs (2.5 mg total) by nebulization every 6 (six) hours as needed for wheezing or shortness of breath. 01/10/18   Emi Holes, PA-C  celecoxib (CELEBREX) 200 MG capsule Take 1 capsule (200 mg total) by mouth 2 (two) times daily. 11/14/17   Jacalyn Lefevre, MD  cyclobenzaprine (FLEXERIL) 10 MG tablet Take 1 tablet (10 mg total) by mouth 3 (three) times daily as needed for up to 12 doses for muscle spasms. 01/02/18   Curatolo, Adam, DO  dextromethorphan-guaiFENesin  (MUCINEX DM) 30-600 MG 12hr tablet Take 1 tablet by mouth 2 (two) times daily. 08/10/17   Azalia Bilis, MD  ibuprofen (ADVIL,MOTRIN) 600 MG tablet Take 1 tablet (600 mg total) by mouth every 6 (six) hours as needed. 01/10/18   Law, Waylan Boga, PA-C  predniSONE (DELTASONE) 20 MG tablet Take 2 tablets (40 mg total) by mouth daily. 03/04/18   Renne Crigler, PA-C  QVAR REDIHALER 40 MCG/ACT inhaler INHALE 1 PUFF PO BID 10/28/17   [provider]    Family History History reviewed. No pertinent family history.  Social History Social History   Tobacco Use  . Smoking status: Former Games developer  . Smokeless tobacco: Never Used  Substance Use Topics  . Alcohol use: Yes    Comment: occasional   . Drug use: No     Allergies   Patient has no known allergies.   Review of Systems Review of Systems  Constitutional: Positive for chills and fatigue. Negative for fever.  HENT: Positive for congestion, rhinorrhea and sinus pressure. Negative for ear pain and sore throat.   Eyes: Negative for redness.  Respiratory: Positive for cough and wheezing. Negative for shortness of breath.   Gastrointestinal: Negative for abdominal pain, diarrhea, nausea and vomiting.  Genitourinary: Negative for dysuria.  Musculoskeletal: Positive for myalgias. Negative for neck stiffness.  Skin: Negative for rash.  Neurological: Negative for headaches.  Hematological: Negative for adenopathy.  Physical Exam Updated Vital Signs BP 133/84 (BP Location: Left Arm)   Pulse 74   Temp 98.2 F (36.8 C) (Oral)   Resp 18   Ht 4\' 7"  (1.397 m)   Wt 59 kg   SpO2 100%   BMI 30.21 kg/m   Physical Exam Vitals signs and nursing note reviewed.  Constitutional:      Appearance: He is well-developed.  HENT:     Head: Normocephalic and atraumatic.     Jaw: No trismus.     Right Ear: Tympanic membrane, ear canal and external ear normal.     Left Ear: Tympanic membrane, ear canal and external ear normal.      Nose: Congestion present. No mucosal edema or rhinorrhea.     Mouth/Throat:     Mouth: Mucous membranes are not dry.     Pharynx: Uvula midline. No oropharyngeal exudate, posterior oropharyngeal erythema or uvula swelling.     Tonsils: No tonsillar abscesses.  Eyes:     General:        Right eye: No discharge.        Left eye: No discharge.     Conjunctiva/sclera: Conjunctivae normal.  Neck:     Musculoskeletal: Normal range of motion and neck supple.  Cardiovascular:     Rate and Rhythm: Normal rate and regular rhythm.     Heart sounds: Normal heart sounds.  Pulmonary:     Effort: Pulmonary effort is normal. No respiratory distress.     Breath sounds: Normal breath sounds. No wheezing or rales.  Abdominal:     Palpations: Abdomen is soft.     Tenderness: There is no abdominal tenderness.  Skin:    General: Skin is warm and dry.  Neurological:     Mental Status: He is alert.      ED Treatments / Results  Labs (all labs ordered are listed, but only abnormal results are displayed) Labs Reviewed - No data to display  EKG None  Radiology No results found.  Procedures Procedures (including critical care time)  Medications Ordered in ED Medications - No data to display   Initial Impression / Assessment and Plan / ED Course  I have reviewed the triage vital signs and the nursing notes.  Pertinent labs & imaging results that were available during my care of the patient were reviewed by me and considered in my medical decision making (see chart for details).     Patient seen and examined.   Vital signs reviewed and are as follows: BP 133/84 (BP Location: Left Arm)   Pulse 74   Temp 98.2 F (36.8 C) (Oral)   Resp 18   Ht 4\' 7"  (1.397 m)   Wt 59 kg   SpO2 100%   BMI 30.21 kg/m   We discussed symptomatic treatment and controlled asthma in setting of viral URI.  Prednisone prescribed if he feels his breathing is getting worse.  Currently lungs are clear on exam.   Patient counseled on supportive care for viral URI and s/s to return including worsening symptoms, persistent fever, persistent vomiting, or if they have any other concerns. Urged to see PCP if symptoms persist for more than 3 days.  Discussed that I cannot entirely rule out influenza, however without fever feel this is less likely.  He is no longer in the treatment window for antiviral medication regardless.  The patient verbalizes understanding and agrees with plan.    Final Clinical Impressions(s) / ED Diagnoses   Final diagnoses:  Viral upper respiratory tract infection with cough   Patient with symptoms consistent with a viral syndrome.  Underlying asthma however appears well compensated in emergency department without any wheezing.  Vitals are stable, no fever. No signs of dehydration. Lung exam normal, low concern for pneumonia.  Feel low risk for influenza given exam.  Supportive therapy indicated with return if symptoms worsen.     ED Discharge Orders         Ordered    predniSONE (DELTASONE) 20 MG tablet  Daily     03/04/18 1255           Renne Crigler, [place] 03/04/18 1300    Maia Plan, MD 03/04/18 2006

## 2018-04-03 ENCOUNTER — Emergency Department (HOSPITAL_BASED_OUTPATIENT_CLINIC_OR_DEPARTMENT_OTHER)
Admission: EM | Admit: 2018-04-03 | Discharge: 2018-04-03 | Disposition: A | Discharge status: Home / Self Care | Payer: BLUE CROSS/BLUE SHIELD | Attending: Emergency Medicine | Admitting: Emergency Medicine

## 2018-04-03 ENCOUNTER — Other Ambulatory Visit: Payer: Self-pay

## 2018-04-03 ENCOUNTER — Encounter (HOSPITAL_BASED_OUTPATIENT_CLINIC_OR_DEPARTMENT_OTHER): Payer: Self-pay | Admitting: Emergency Medicine

## 2018-04-03 DIAGNOSIS — Z87891 Personal history of nicotine dependence: Secondary | ICD-10-CM | POA: Diagnosis not present

## 2018-04-03 DIAGNOSIS — J45909 Unspecified asthma, uncomplicated: Secondary | ICD-10-CM | POA: Insufficient documentation

## 2018-04-03 DIAGNOSIS — Z79899 Other long term (current) drug therapy: Secondary | ICD-10-CM | POA: Insufficient documentation

## 2018-04-03 DIAGNOSIS — R197 Diarrhea, unspecified: Secondary | ICD-10-CM | POA: Diagnosis not present

## 2018-04-03 DIAGNOSIS — R112 Nausea with vomiting, unspecified: Secondary | ICD-10-CM

## 2018-04-03 LAB — BASIC METABOLIC PANEL
ANION GAP: 8 (ref 5–15)
BUN: 16 mg/dL (ref 6–20)
CHLORIDE: 105 mmol/L (ref 98–111)
CO2: 22 mmol/L (ref 22–32)
Calcium: 9.4 mg/dL (ref 8.9–10.3)
Creatinine, Ser: 0.84 mg/dL (ref 0.61–1.24)
GFR calc non Af Amer: >60 mL/min (ref >60)
GLUCOSE: 89 mg/dL (ref 70–99)
Potassium: 3.6 mmol/L (ref 3.5–5.1)
Sodium: 135 mmol/L (ref 135–145)

## 2018-04-03 LAB — CBC
HCT: 43.1 % (ref 39.0–52.0)
HEMOGLOBIN: 14.4 g/dL (ref 13.0–17.0)
MCH: 28.4 pg (ref 26.0–34.0)
MCHC: 33.4 g/dL (ref 30.0–36.0)
MCV: 85 fL (ref 80.0–100.0)
NRBC: 0 % (ref 0.0–0.2)
Platelets: 185 10*3/uL (ref 150–400)
RBC: 5.07 MIL/uL (ref 4.22–5.81)
RDW: 11.9 % (ref 11.5–15.5)
WBC: 4 10*3/uL (ref 4.0–10.5)

## 2018-04-03 LAB — INFLUENZA PANEL BY PCR (TYPE A & B)
INFLBPCR: NEGATIVE
Influenza A By PCR: NEGATIVE

## 2018-04-03 MED ORDER — ONDANSETRON HCL 4 MG/2ML IJ SOLN
4.0000 mg | Freq: Once | INTRAMUSCULAR | Status: AC
  Administered 2018-04-03: 4 mg via INTRAVENOUS
  Filled 2018-04-03: qty 2

## 2018-04-03 MED ORDER — SODIUM CHLORIDE 0.9 % IV BOLUS
1000.0000 mL | Freq: Once | INTRAVENOUS | Status: AC
  Administered 2018-04-03: 1000 mL via INTRAVENOUS

## 2018-04-03 MED ORDER — ONDANSETRON 8 MG PO TBDP: 8.0000 mg | ORAL_TABLET | Freq: Three times a day (TID) | ORAL | 0 refills | Status: DC | PRN

## 2018-04-03 NOTE — ED Triage Notes (Addendum)
Pt c/o vomiting, diarrhea since Thursday. Pt denies fever, denies abdominal pain, states he is having chills.

## 2018-04-03 NOTE — ED Provider Notes (Signed)
[facility] EMERGENCY DEPARTMENT Provider Note   CSN: [id number] Arrival date & time: 04/03/18  1059    History   Chief Complaint Chief Complaint  Patient presents with  . Emesis    HPI Cameron Caldwell is a 23 y.o. male.     HPI Patient reports nausea vomiting and diarrhea since last Thursday.  He has myalgias.  He feels generally weak.  Denies fever.  He has having chills.  No hemoptysis.  No hematochezia.  No recent sick contacts.  Symptoms are moderate in severity.  No other complaints.  Denies focal abdominal pain   Past Medical History:  Diagnosis Date  . Achondroplasia   . Asthma     There are no active problems to display for this patient.   History reviewed. No pertinent surgical history.      Home Medications    Prior to Admission medications   Medication Sig Start Date End Date Taking? Authorizing Provider  acetaminophen (TYLENOL) 500 MG tablet Take 1 tablet (500 mg total) by mouth every 6 (six) hours as needed. 01/10/18   Law, Waylan Boga, PA-C  albuterol (PROVENTIL HFA;VENTOLIN HFA) 108 (90 Base) MCG/ACT inhaler Inhale 2 puffs into the lungs every 4 (four) hours as needed for wheezing or shortness of breath. 01/10/18   Law, Waylan Boga, PA-C  albuterol (PROVENTIL) (2.5 MG/3ML) 0.083% nebulizer solution Take 3 mLs (2.5 mg total) by nebulization every 6 (six) hours as needed for wheezing or shortness of breath. 01/10/18   Emi Holes, PA-C  celecoxib (CELEBREX) 200 MG capsule Take 1 capsule (200 mg total) by mouth 2 (two) times daily. 11/14/17   Jacalyn Lefevre, MD  cyclobenzaprine (FLEXERIL) 10 MG tablet Take 1 tablet (10 mg total) by mouth 3 (three) times daily as needed for up to 12 doses for muscle spasms. 01/02/18   Curatolo, Adam, DO  dextromethorphan-guaiFENesin (MUCINEX DM) 30-600 MG 12hr tablet Take 1 tablet by mouth 2 (two) times daily. 08/10/17   Azalia Bilis, MD  ibuprofen (ADVIL,MOTRIN) 600 MG tablet Take 1 tablet (600 mg total)  by mouth every 6 (six) hours as needed. 01/10/18   Law, Waylan Boga, PA-C  predniSONE (DELTASONE) 20 MG tablet Take 2 tablets (40 mg total) by mouth daily. 03/04/18   Renne Crigler, PA-C  QVAR REDIHALER 40 MCG/ACT inhaler INHALE 1 PUFF PO BID 10/28/17   [provider]    Family History History reviewed. No pertinent family history.  Social History Social History   Tobacco Use  . Smoking status: Former Games developer  . Smokeless tobacco: Never Used  Substance Use Topics  . Alcohol use: Yes    Comment: occasional   . Drug use: No     Allergies   Patient has no known allergies.   Review of Systems Review of Systems  All other systems reviewed and are negative.    Physical Exam Updated Vital Signs BP 120/69 (BP Location: Right Arm)   Pulse 76   Temp 98.3 F (36.8 C) (Oral)   Resp 16   Ht 4\' 7"  (1.397 m)   Wt 59 kg   SpO2 99%   BMI 30.21 kg/m   Physical Exam Vitals signs and nursing note reviewed.  Constitutional:      Appearance: He is well-developed.  HENT:     Head: Normocephalic and atraumatic.  Neck:     Musculoskeletal: Normal range of motion.  Cardiovascular:     Rate and Rhythm: Normal rate and regular rhythm.  Heart sounds: Normal heart sounds.  Pulmonary:     Effort: Pulmonary effort is normal. No respiratory distress.     Breath sounds: Normal breath sounds.  Abdominal:     General: There is no distension.     Palpations: Abdomen is soft.     Tenderness: There is no abdominal tenderness.  Musculoskeletal: Normal range of motion.  Skin:    General: Skin is warm and dry.  Neurological:     Mental Status: He is alert and oriented to person, place, and time.  Psychiatric:        Judgment: Judgment normal.      ED Treatments / Results  Labs (all labs ordered are listed, but only abnormal results are displayed) Labs Reviewed  CBC  BASIC METABOLIC PANEL  INFLUENZA PANEL BY PCR (TYPE A & B)    EKG None  Radiology No results  found.  Procedures Procedures (including critical care time)  Medications Ordered in ED Medications  sodium chloride 0.9 % bolus 1,000 mL (1,000 mLs Intravenous New Bag/Given 04/03/18 1206)  ondansetron (ZOFRAN) injection 4 mg (4 mg Intravenous Given 04/03/18 1202)     Initial Impression / Assessment and Plan / ED Course  I have reviewed the triage vital signs and the nursing notes.  Pertinent labs & imaging results that were available during my care of the patient were reviewed by me and considered in my medical decision making (see chart for details).        Patient will be evaluated for influenza.  Likely viral gastroenteritis.  Abdominal exam is benign.  Plan for discharge home with nausea medication.  I will follow-up on his influenza and contact the patient if it is positive.  Final Clinical Impressions(s) / ED Diagnoses   Final diagnoses:  None    ED Discharge Orders    None       Azalia Bilis, MD 04/03/18 1304

## 2018-04-03 NOTE — ED Notes (Signed)
[facility] labs drawn, sent to lab

## 2018-04-17 ENCOUNTER — Encounter (HOSPITAL_BASED_OUTPATIENT_CLINIC_OR_DEPARTMENT_OTHER): Payer: Self-pay | Admitting: Emergency Medicine

## 2018-04-17 ENCOUNTER — Emergency Department (HOSPITAL_BASED_OUTPATIENT_CLINIC_OR_DEPARTMENT_OTHER)
Admission: EM | Admit: 2018-04-17 | Discharge: 2018-04-17 | Disposition: A | Discharge status: Home / Self Care | Payer: BLUE CROSS/BLUE SHIELD | Attending: Emergency Medicine | Admitting: Emergency Medicine

## 2018-04-17 ENCOUNTER — Other Ambulatory Visit: Payer: Self-pay

## 2018-04-17 DIAGNOSIS — J4521 Mild intermittent asthma with (acute) exacerbation: Secondary | ICD-10-CM | POA: Diagnosis not present

## 2018-04-17 DIAGNOSIS — R0602 Shortness of breath: Secondary | ICD-10-CM | POA: Diagnosis not present

## 2018-04-17 DIAGNOSIS — Z79899 Other long term (current) drug therapy: Secondary | ICD-10-CM | POA: Diagnosis not present

## 2018-04-17 DIAGNOSIS — Z87891 Personal history of nicotine dependence: Secondary | ICD-10-CM | POA: Insufficient documentation

## 2018-04-17 DIAGNOSIS — R05 Cough: Secondary | ICD-10-CM | POA: Insufficient documentation

## 2018-04-17 DIAGNOSIS — R062 Wheezing: Secondary | ICD-10-CM | POA: Diagnosis present

## 2018-04-17 MED ORDER — METHYLPREDNISOLONE SODIUM SUCC 125 MG IJ SOLR
125.0000 mg | Freq: Once | INTRAMUSCULAR | Status: AC
  Administered 2018-04-17: 125 mg via INTRAMUSCULAR
  Filled 2018-04-17: qty 2

## 2018-04-17 MED ORDER — PREDNISONE 20 MG PO TABS: 40.0000 mg | ORAL_TABLET | Freq: Every day | ORAL | 0 refills | Status: DC

## 2018-04-17 MED ORDER — ALBUTEROL SULFATE (2.5 MG/3ML) 0.083% IN NEBU: 2.5000 mg | INHALATION_SOLUTION | Freq: Four times a day (QID) | RESPIRATORY_TRACT | 0 refills | Status: DC | PRN

## 2018-04-17 NOTE — ED Triage Notes (Signed)
States asthma bothering him yesterday and today. States homes nebs not working.mild wheezes in triage. No distress.

## 2018-04-17 NOTE — ED Provider Notes (Signed)
[facility] EMERGENCY DEPARTMENT Provider Note   CSN: [id number] Arrival date & time: 04/17/18  2110    History   Chief Complaint Chief Complaint  Patient presents with  . Shortness of Breath    HPI Cameron Caldwell is a 23 y.o. male.     HPI Presents with several days of wheezing.  States he ran out of his nebulized treatment at home.  Minimal cough.  No fever or chills.  No sick contacts or recent travel. Past Medical History:  Diagnosis Date  . Achondroplasia   . Asthma     There are no active problems to display for this patient.   History reviewed. No pertinent surgical history.      Home Medications    Prior to Admission medications   Medication Sig Start Date End Date Taking? Authorizing Provider  acetaminophen (TYLENOL) 500 MG tablet Take 1 tablet (500 mg total) by mouth every 6 (six) hours as needed. 01/10/18   Law, Waylan Boga, PA-C  albuterol (PROVENTIL HFA;VENTOLIN HFA) 108 (90 Base) MCG/ACT inhaler Inhale 2 puffs into the lungs every 4 (four) hours as needed for wheezing or shortness of breath. 01/10/18   Law, Waylan Boga, PA-C  albuterol (PROVENTIL) (2.5 MG/3ML) 0.083% nebulizer solution Take 3 mLs (2.5 mg total) by nebulization every 6 (six) hours as needed for wheezing or shortness of breath. 04/17/18   Loren Racer, MD  celecoxib (CELEBREX) 200 MG capsule Take 1 capsule (200 mg total) by mouth 2 (two) times daily. 11/14/17   Jacalyn Lefevre, MD  cyclobenzaprine (FLEXERIL) 10 MG tablet Take 1 tablet (10 mg total) by mouth 3 (three) times daily as needed for up to 12 doses for muscle spasms. 01/02/18   Curatolo, Adam, DO  dextromethorphan-guaiFENesin (MUCINEX DM) 30-600 MG 12hr tablet Take 1 tablet by mouth 2 (two) times daily. 08/10/17   Azalia Bilis, MD  ibuprofen (ADVIL,MOTRIN) 600 MG tablet Take 1 tablet (600 mg total) by mouth every 6 (six) hours as needed. 01/10/18   Law, Waylan Boga, PA-C  ondansetron (ZOFRAN ODT) 8 MG disintegrating  tablet Take 1 tablet (8 mg total) by mouth every 8 (eight) hours as needed for nausea or vomiting. 04/03/18   Azalia Bilis, MD  predniSONE (DELTASONE) 20 MG tablet Take 2 tablets (40 mg total) by mouth daily. 04/18/18   Loren Racer, MD  QVAR REDIHALER 40 MCG/ACT inhaler INHALE 1 PUFF PO BID 10/28/17   [provider]    Family History No family history on file.  Social History Social History   Tobacco Use  . Smoking status: Former Games developer  . Smokeless tobacco: Never Used  Substance Use Topics  . Alcohol use: Yes    Comment: occasional   . Drug use: No     Allergies   Patient has no known allergies.   Review of Systems Review of Systems  Constitutional: Negative for chills and fever.  HENT: Negative for congestion, sinus pressure, sinus pain and sore throat.   Respiratory: Positive for cough, shortness of breath and wheezing.   Cardiovascular: Negative for chest pain, palpitations and leg swelling.  Skin: Negative for rash.  Neurological: Negative for dizziness, weakness, light-headedness, numbness and headaches.  All other systems reviewed and are negative.    Physical Exam Updated Vital Signs BP 120/81 (BP Location: Left Arm)   Pulse 98   Temp 98.6 F (37 C) (Oral)   Resp 16   Wt 59.4 kg   SpO2 98%   BMI 30.45 kg/m  Physical Exam Vitals signs and nursing note reviewed.  Constitutional:      Appearance: Normal appearance. He is well-developed.  HENT:     Head: Normocephalic and atraumatic.     Nose: Nose normal.     Mouth/Throat:     Mouth: Mucous membranes are moist.  Eyes:     Pupils: Pupils are equal, round, and reactive to light.  Neck:     Musculoskeletal: Normal range of motion and neck supple. No neck rigidity or muscular tenderness.  Cardiovascular:     Rate and Rhythm: Normal rate and regular rhythm.     Heart sounds: No murmur. No friction rub. No gallop.   Pulmonary:     Effort: Pulmonary effort is normal.     Comments: Mildly  diminished breath sounds in bilateral bases.  No respiratory distress. Abdominal:     General: Bowel sounds are normal.     Palpations: Abdomen is soft.     Tenderness: There is no abdominal tenderness. There is no guarding or rebound.  Musculoskeletal: Normal range of motion.        General: No swelling, tenderness, deformity or signs of injury.     Right lower leg: No edema.     Left lower leg: No edema.  Lymphadenopathy:     Cervical: No cervical adenopathy.  Skin:    General: Skin is warm and dry.     Capillary Refill: Capillary refill takes less than 2 seconds.     Findings: No erythema or rash.  Neurological:     General: No focal deficit present.     Mental Status: He is alert and oriented to person, place, and time.  Psychiatric:        Behavior: Behavior normal.      ED Treatments / Results  Labs (all labs ordered are listed, but only abnormal results are displayed) Labs Reviewed - No data to display  EKG None  Radiology No results found.  Procedures Procedures (including critical care time)  Medications Ordered in ED Medications  methylPREDNISolone sodium succinate (SOLU-MEDROL) 125 mg/2 mL injection 125 mg (125 mg Intramuscular Given 04/17/18 2200)     Initial Impression / Assessment and Plan / ED Course  I have reviewed the triage vital signs and the nursing notes.  Pertinent labs & imaging results that were available during my care of the patient were reviewed by me and considered in my medical decision making (see chart for details).        Likely mild asthma exacerbation.  Patient is well-appearing.  Do not believe imaging is necessary at this point.  Will give course of steroids and refill his albuterol nebulized solution.  Return precautions given.  Final Clinical Impressions(s) / ED Diagnoses   Final diagnoses:  Mild intermittent asthma with exacerbation    ED Discharge Orders         Ordered    predniSONE (DELTASONE) 20 MG tablet  Daily      04/17/18 2150    albuterol (PROVENTIL) (2.5 MG/3ML) 0.083% nebulizer solution  Every 6 hours PRN     04/17/18 2150           Loren Racer, MD 04/17/18 2203

## 2018-08-16 ENCOUNTER — Encounter (HOSPITAL_BASED_OUTPATIENT_CLINIC_OR_DEPARTMENT_OTHER): Payer: Self-pay | Admitting: Emergency Medicine

## 2018-08-16 ENCOUNTER — Other Ambulatory Visit: Payer: Self-pay

## 2018-08-16 ENCOUNTER — Emergency Department (HOSPITAL_BASED_OUTPATIENT_CLINIC_OR_DEPARTMENT_OTHER)
Admission: EM | Admit: 2018-08-16 | Discharge: 2018-08-16 | Disposition: A | Discharge status: Home / Self Care | Payer: Self-pay | Attending: Emergency Medicine | Admitting: Emergency Medicine

## 2018-08-16 DIAGNOSIS — Z79899 Other long term (current) drug therapy: Secondary | ICD-10-CM | POA: Insufficient documentation

## 2018-08-16 DIAGNOSIS — J4541 Moderate persistent asthma with (acute) exacerbation: Secondary | ICD-10-CM | POA: Insufficient documentation

## 2018-08-16 DIAGNOSIS — Z87891 Personal history of nicotine dependence: Secondary | ICD-10-CM | POA: Insufficient documentation

## 2018-08-16 HISTORY — DX: Dorsalgia, unspecified: M54.9

## 2018-08-16 MED ORDER — ALBUTEROL SULFATE HFA 108 (90 BASE) MCG/ACT IN AERS
INHALATION_SPRAY | RESPIRATORY_TRACT | Status: AC
  Filled 2018-08-16: qty 6.7

## 2018-08-16 MED ORDER — PREDNISONE 10 MG PO TABS: 20.0000 mg | ORAL_TABLET | Freq: Two times a day (BID) | ORAL | 0 refills | Status: DC

## 2018-08-16 MED ORDER — ALBUTEROL SULFATE HFA 108 (90 BASE) MCG/ACT IN AERS
2.0000 | INHALATION_SPRAY | RESPIRATORY_TRACT | Status: DC | PRN
  Administered 2018-08-16: 2 via RESPIRATORY_TRACT

## 2018-08-16 NOTE — ED Provider Notes (Signed)
[facility] EMERGENCY DEPARTMENT Provider Note   CSN: [id number] Arrival date & time: 08/16/18  0700     History   Chief Complaint Chief Complaint  Patient presents with  . Shortness of Breath    HPI Cameron Caldwell is a 23 y.o. male.     Patient is a 23 year old male with past medical history of asthma presenting with complaints of shortness of breath.  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  He states that this flared up his asthma.  He feels scratchy in the throat and has been having wheezing.  He has been using his inhaler and nebulizer with some relief.  He denies any fevers or chills.  He denies any cough.  The history is provided by the patient.  Shortness of Breath Severity:  Moderate Onset quality:  Sudden Duration:  24 hours Timing:  Constant Progression:  Worsening Chronicity:  New Relieved by:  Nothing Worsened by:  Nothing Ineffective treatments:  Inhaler Associated symptoms: no fever     Past Medical History:  Diagnosis Date  . Achondroplasia   . Asthma   . Back pain     There are no active problems to display for this patient.   History reviewed. No pertinent surgical history.      Home Medications    Prior to Admission medications   Medication Sig Start Date End Date Taking? Authorizing Provider  albuterol (PROVENTIL HFA;VENTOLIN HFA) 108 (90 Base) MCG/ACT inhaler Inhale 2 puffs into the lungs every 4 (four) hours as needed for wheezing or shortness of breath. 01/10/18   Law, Waylan BogaAlexandra M, PA-C  albuterol (PROVENTIL) (2.5 MG/3ML) 0.083% nebulizer solution Take 3 mLs (2.5 mg total) by nebulization every 6 (six) hours as needed for wheezing or shortness of breath. 04/17/18   Loren RacerYelverton, David, MD  naproxen (NAPROSYN) 500 MG tablet TK 1 T PO BID 03/27/18   [provider]  oxyCODONE-acetaminophen (PERCOCET/ROXICET) 5-325 MG tablet TK 1 T PO D 03/27/18   [provider]  QVAR REDIHALER 40 MCG/ACT inhaler INHALE 1 PUFF PO BID 10/28/17   [provider]    Family History No family history on file.  Social History Social History   Tobacco Use  . Smoking status: Former Games developermoker  . Smokeless tobacco: Never Used  Substance Use Topics  . Alcohol use: Yes    Comment: occasional   . Drug use: No     Allergies   Patient has no known allergies.   Review of Systems Review of Systems  Constitutional: Negative for fever.  Respiratory: Positive for shortness of breath.   All other systems reviewed and are negative.    Physical Exam Updated Vital Signs BP 127/86 (BP Location: Right Arm)   Pulse (!) 105   Temp 98.6 F (37 C) (Oral)   Resp 20   Wt 54.5 kg   SpO2 100%   BMI 27.91 kg/m   Physical Exam Vitals signs and nursing note reviewed.  Constitutional:      General: He is not in acute distress.    Appearance: He is well-developed. He is not diaphoretic.  HENT:     Head: Normocephalic and atraumatic.  Neck:     Musculoskeletal: Normal range of motion and neck supple.  Cardiovascular:     Rate and Rhythm: Normal rate and regular rhythm.     Heart sounds: No murmur. No friction rub.  Pulmonary:  Effort: Pulmonary effort is normal. No respiratory distress.     Breath sounds: Normal breath sounds. No wheezing or rales.  Abdominal:     General: Bowel sounds are normal. There is no distension.     Palpations: Abdomen is soft.     Tenderness: There is no abdominal tenderness.  Musculoskeletal: Normal range of motion.  Skin:    General: Skin is warm and dry.  Neurological:     Mental Status: He is alert and oriented to person, place, and time.     Coordination: Coordination normal.      ED Treatments / Results  Labs (all labs ordered are listed, but only abnormal results are displayed) Labs Reviewed - No data to display  EKG None  Radiology No results found.  Procedures Procedures (including  critical care time)  Medications Ordered in ED Medications - No data to display   Initial Impression / Assessment and Plan / ED Course  I have reviewed the triage vital signs and the nursing notes.  Pertinent labs & imaging results that were available during my care of the patient were reviewed by me and considered in my medical decision making (see chart for details).  Patient not actively wheezing and oxygen saturations are adequate.  He had a breathing treatment just prior to coming here.  He will be treated with prednisone and Benadryl as needed for his itching.  He is to use his albuterol MDI and nebulizer as before.  Final Clinical Impressions(s) / ED Diagnoses   Final diagnoses:  None    ED Discharge Orders    None       Veryl Speak, MD 08/16/18 [phone]

## 2018-08-16 NOTE — Discharge Instructions (Addendum)
Begin taking prednisone as prescribed today.  Benadryl 25 mg every 6 hours as needed for itching or scratchiness.  Continue use of your inhaler and nebulizer as before.  Follow-up with your primary doctor if not improving in the next few days, and return to the ER if you develop worsening breathing, high fever, severe chest pain, or other new and concerning symptoms.

## 2018-08-16 NOTE — ED Triage Notes (Signed)
Was around hay yesterday and it has his asthma "flared up." Last neb at home was 6am.  Pt in NAD.

## 2018-12-06 ENCOUNTER — Encounter (HOSPITAL_BASED_OUTPATIENT_CLINIC_OR_DEPARTMENT_OTHER): Payer: Self-pay | Admitting: *Deleted

## 2018-12-06 ENCOUNTER — Other Ambulatory Visit: Payer: Self-pay

## 2018-12-06 ENCOUNTER — Emergency Department (HOSPITAL_BASED_OUTPATIENT_CLINIC_OR_DEPARTMENT_OTHER)
Admission: EM | Admit: 2018-12-06 | Discharge: 2018-12-06 | Disposition: A | Discharge status: Home / Self Care | Payer: Self-pay | Attending: Emergency Medicine | Admitting: Emergency Medicine

## 2018-12-06 DIAGNOSIS — R1032 Left lower quadrant pain: Secondary | ICD-10-CM

## 2018-12-06 DIAGNOSIS — J45909 Unspecified asthma, uncomplicated: Secondary | ICD-10-CM | POA: Insufficient documentation

## 2018-12-06 NOTE — Discharge Instructions (Addendum)
Negative evaluation and exam.  No significant abnormalities.  Return for any new or worse symptoms.

## 2018-12-06 NOTE — ED Provider Notes (Signed)
[facility] EMERGENCY DEPARTMENT Provider Note   CSN: [id number] Arrival date & time: 12/06/18  1922     History   Chief Complaint Chief Complaint  Patient presents with  . Testicle Pain    HPI Cameron Caldwell is a 23 y.o. male.     Patient states that he had cyst in his left groin crease approximately 2 weeks ago which he popped and pus came out.  Patient is now concerned that it may have been related to an STD.  Patient denies any lesions on the penis denies any blisters in the groin area denies any discharge.  Patient denies any concern for STD exposure with a partner.  Patient states that that cyst of the pus came out of is healed up and everything seems to be back to normal now.  Patient with Achondroplasia.     Past Medical History:  Diagnosis Date  . Achondroplasia   . Asthma   . Back pain     There are no active problems to display for this patient.   History reviewed. No pertinent surgical history.      Home Medications    Prior to Admission medications   Medication Sig Start Date End Date Taking? Authorizing Provider  albuterol (PROVENTIL HFA;VENTOLIN HFA) 108 (90 Base) MCG/ACT inhaler Inhale 2 puffs into the lungs every 4 (four) hours as needed for wheezing or shortness of breath. 01/10/18   Law, Bea Graff, PA-C  albuterol (PROVENTIL) (2.5 MG/3ML) 0.083% nebulizer solution Take 3 mLs (2.5 mg total) by nebulization every 6 (six) hours as needed for wheezing or shortness of breath. 04/17/18   Julianne Rice, MD  naproxen (NAPROSYN) 500 MG tablet TK 1 T PO BID 03/27/18   [provider]  oxyCODONE-acetaminophen (PERCOCET/ROXICET) 5-325 MG tablet TK 1 T PO D 03/27/18   [provider]  predniSONE (DELTASONE) 10 MG tablet Take 2 tablets (20 mg total) by mouth 2 (two) times daily with a meal. 08/16/18   Veryl Speak, MD  QVAR REDIHALER 40 MCG/ACT inhaler INHALE 1 PUFF PO BID 10/28/17   [provider]    Family History  History reviewed. No pertinent family history.  Social History Social History   Tobacco Use  . Smoking status: Former Research scientist (life sciences)  . Smokeless tobacco: Never Used  Substance Use Topics  . Alcohol use: Yes    Comment: occasional   . Drug use: No     Allergies   Patient has no known allergies.   Review of Systems Review of Systems  Constitutional: Negative for chills and fever.  HENT: Negative for congestion, rhinorrhea and sore throat.   Eyes: Negative for visual disturbance.  Respiratory: Negative for cough and shortness of breath.   Cardiovascular: Negative for chest pain and leg swelling.  Gastrointestinal: Negative for abdominal pain, diarrhea, nausea and vomiting.  Genitourinary: Negative for difficulty urinating, discharge, dysuria, genital sores, scrotal swelling and testicular pain.  Musculoskeletal: Negative for back pain and neck pain.  Skin: Negative for rash.  Neurological: Negative for dizziness, light-headedness and headaches.  Hematological: Does not bruise/bleed easily.  Psychiatric/Behavioral: Negative for confusion.     Physical Exam Updated Vital Signs BP 131/87 (BP Location: Right Arm)   Pulse 81   Temp 98.4 F (36.9 C) (Oral)   Resp 16   Ht 1.397 m (4\' 7" )   Wt 54.4 kg   SpO2 100%   BMI 27.89 kg/m   Physical Exam Vitals signs and nursing note reviewed.  Constitutional:  Appearance: Normal appearance. He is well-developed.  HENT:     Head: Normocephalic and atraumatic.  Eyes:     Extraocular Movements: Extraocular movements intact.     Conjunctiva/sclera: Conjunctivae normal.     Pupils: Pupils are equal, round, and reactive to light.  Neck:     Musculoskeletal: Neck supple.  Cardiovascular:     Rate and Rhythm: Normal rate and regular rhythm.     Heart sounds: No murmur.  Pulmonary:     Effort: Pulmonary effort is normal. No respiratory distress.     Breath sounds: Normal breath sounds.  Abdominal:     Palpations: Abdomen is  soft.     Tenderness: There is no abdominal tenderness.  Genitourinary:    Penis: Normal.      Scrotum/Testes: Normal.     Comments: Circumcised no discharge no lesions.  No evidence of cyst or vesicles.  No adenopathy no hernia.  No tenderness to the testicles.  No mass. Musculoskeletal: Normal range of motion.  Skin:    General: Skin is warm and dry.  Neurological:     General: No focal deficit present.     Mental Status: He is alert and oriented to person, place, and time.      ED Treatments / Results  Labs (all labs ordered are listed, but only abnormal results are displayed) Labs Reviewed - No data to display  EKG None  Radiology No results found.  Procedures Procedures (including critical care time)  Medications Ordered in ED Medications - No data to display   Initial Impression / Assessment and Plan / ED Course  I have reviewed the triage vital signs and the nursing notes.  Pertinent labs & imaging results that were available during my care of the patient were reviewed by me and considered in my medical decision making (see chart for details).        By history sounds as if patient had a follicle cyst in the left groin crease area.  This completely healed.  Patient was somewhat concerned about STD being related to it.  But patient does not relate any historical information consistent with STD.  Has no exposure that he is concerned about.  Did offer STD prophylaxis if he wanted.  Patient decided not because he said he really had low threshold concern for that.  Final Clinical Impressions(s) / ED Diagnoses   Final diagnoses:  Groin pain, left    ED Discharge Orders    None       Vanetta Mulders, MD 12/06/18 2105

## 2018-12-06 NOTE — ED Triage Notes (Signed)
Pt c/o groin pain x 1 week ago started as rash

## 2019-04-20 ENCOUNTER — Encounter (HOSPITAL_BASED_OUTPATIENT_CLINIC_OR_DEPARTMENT_OTHER): Payer: Self-pay

## 2019-04-20 ENCOUNTER — Emergency Department (HOSPITAL_BASED_OUTPATIENT_CLINIC_OR_DEPARTMENT_OTHER)
Admission: EM | Admit: 2019-04-20 | Discharge: 2019-04-20 | Disposition: A | Discharge status: Home / Self Care | Payer: BC Managed Care – PPO | Attending: Emergency Medicine | Admitting: Emergency Medicine

## 2019-04-20 ENCOUNTER — Other Ambulatory Visit: Payer: Self-pay

## 2019-04-20 DIAGNOSIS — Z79899 Other long term (current) drug therapy: Secondary | ICD-10-CM | POA: Diagnosis not present

## 2019-04-20 DIAGNOSIS — J029 Acute pharyngitis, unspecified: Secondary | ICD-10-CM | POA: Diagnosis present

## 2019-04-20 DIAGNOSIS — J45909 Unspecified asthma, uncomplicated: Secondary | ICD-10-CM | POA: Diagnosis not present

## 2019-04-20 DIAGNOSIS — Z87891 Personal history of nicotine dependence: Secondary | ICD-10-CM | POA: Insufficient documentation

## 2019-04-20 LAB — GROUP A STREP BY PCR: Group A Strep by PCR: NOT DETECTED

## 2019-04-20 MED ORDER — DEXAMETHASONE 6 MG PO TABS
10.0000 mg | ORAL_TABLET | Freq: Once | ORAL | Status: AC
  Administered 2019-04-20: 10 mg via ORAL
  Filled 2019-04-20: qty 1

## 2019-04-20 NOTE — ED Notes (Signed)
ED Provider at bedside. 

## 2019-04-20 NOTE — Discharge Instructions (Signed)
Your strep test was negative.  I believe your symptoms are secondary to allergies.  You were given a steroid today which I think will help.  Consider buying Flonase over-the-counter which is a nasal spray which may also help you.

## 2019-04-20 NOTE — ED Triage Notes (Signed)
Pt c/o sore throat X3 days. Denies cough, fever, SOB.

## 2019-04-20 NOTE — ED Provider Notes (Signed)
[facility] EMERGENCY DEPARTMENT Provider Note   CSN: [id number] Arrival date & time: 04/20/19  0757     History Chief Complaint  Patient presents with  . Sore Throat    Cameron Caldwell is a 24 y.o. male.  The history is provided by the patient.  Sore Throat This is a new problem. The current episode started more than 2 days ago. The problem occurs daily. The problem has not changed since onset.Pertinent negatives include no chest pain, no abdominal pain, no headaches and no shortness of breath. Nothing aggravates the symptoms. Nothing relieves the symptoms. He has tried nothing for the symptoms. The treatment provided no relief.       Past Medical History:  Diagnosis Date  . Achondroplasia   . Asthma   . Back pain     There are no problems to display for this patient.   History reviewed. No pertinent surgical history.     No family history on file.  Social History   Tobacco Use  . Smoking status: Former Games developer  . Smokeless tobacco: Never Used  Substance Use Topics  . Alcohol use: Yes    Comment: occasional   . Drug use: No    Home Medications Prior to Admission medications   Medication Sig Start Date End Date Taking? Authorizing Provider  albuterol (PROVENTIL HFA;VENTOLIN HFA) 108 (90 Base) MCG/ACT inhaler Inhale 2 puffs into the lungs every 4 (four) hours as needed for wheezing or shortness of breath. 01/10/18   Law, Waylan Boga, PA-C  albuterol (PROVENTIL) (2.5 MG/3ML) 0.083% nebulizer solution Take 3 mLs (2.5 mg total) by nebulization every 6 (six) hours as needed for wheezing or shortness of breath. 04/17/18   Loren Racer, MD  naproxen (NAPROSYN) 500 MG tablet TK 1 T PO BID 03/27/18   [provider]  oxyCODONE-acetaminophen (PERCOCET/ROXICET) 5-325 MG tablet TK 1 T PO D 03/27/18   [provider]  predniSONE (DELTASONE) 10 MG tablet Take 2 tablets (20 mg total) by mouth 2 (two) times daily with a meal. 08/16/18   Geoffery Lyons,  MD  QVAR REDIHALER 40 MCG/ACT inhaler INHALE 1 PUFF PO BID 10/28/17   [provider]    Allergies    Patient has no known allergies.  Review of Systems   Review of Systems  Constitutional: Negative for fever.  HENT: Positive for postnasal drip and sore throat. Negative for congestion, drooling, ear discharge, facial swelling, hearing loss, sinus pressure, sinus pain, trouble swallowing and voice change.   Respiratory: Negative for shortness of breath.   Cardiovascular: Negative for chest pain.  Gastrointestinal: Negative for abdominal pain.  Neurological: Negative for headaches.    Physical Exam Updated Vital Signs BP (!) 137/100 (BP Location: Right Arm)   Pulse 84   Temp 97.7 F (36.5 C) (Oral)   Resp 18   Ht 4\' 7"  (1.397 m)   Wt 59 kg   SpO2 100%   BMI 30.21 kg/m   Physical Exam Constitutional:      General: He is not in acute distress.    Appearance: He is not ill-appearing.  HENT:     Head: Normocephalic and atraumatic.     Nose: No congestion or rhinorrhea.     Mouth/Throat:     Mouth: Mucous membranes are moist.     Pharynx: No oropharyngeal exudate or posterior oropharyngeal erythema.     Tonsils: No tonsillar exudate or tonsillar abscesses. 0 on the right. 0 on the left.  Eyes:  Conjunctiva/sclera: Conjunctivae normal.  Skin:    General: Skin is warm.  Neurological:     Mental Status: He is alert.     ED Results / Procedures / Treatments   Labs (all labs ordered are listed, but only abnormal results are displayed) Labs Reviewed  GROUP A STREP BY PCR    EKG None  Radiology No results found.  Procedures Procedures (including critical care time)  Medications Ordered in ED Medications  dexamethasone (DECADRON) tablet 10 mg (10 mg Oral Given 04/20/19 6767)    ED Course  I have reviewed the triage vital signs and the nursing notes.  Pertinent labs & imaging results that were available during my care of the patient were reviewed by  me and considered in my medical decision making (see chart for details).    MDM Rules/Calculators/A&P                      Cameron Caldwell is a 24 year old male with history of asthma who presents to the ED with sore throat for the last 3 days.  Has had some postnasal drip.  No fever.  Vital signs overall unremarkable.  No fever here.  No shortness of breath, cough.  No signs of peritonsillar abscess or retropharyngeal abscess.  Possibly postnasal drip/allergies versus strep pharyngitis.  Will give Decadron and evaluate for strep pharyngitis.  Strep screen is negative.  Likely allergy related.  Recommend Flonase.  Discharged in good condition.  This chart was dictated using voice recognition software.  Despite best efforts to proofread,  errors can occur which can change the documentation meaning.    Final Clinical Impression(s) / ED Diagnoses Final diagnoses:  Sore throat    Rx / DC Orders ED Discharge Orders    None       Lennice Sites, DO 04/20/19 2094

## 2019-06-13 ENCOUNTER — Emergency Department (HOSPITAL_BASED_OUTPATIENT_CLINIC_OR_DEPARTMENT_OTHER)
Admission: EM | Admit: 2019-06-13 | Discharge: 2019-06-13 | Disposition: A | Discharge status: Home / Self Care | Payer: BC Managed Care – PPO | Attending: Emergency Medicine | Admitting: Emergency Medicine

## 2019-06-13 ENCOUNTER — Other Ambulatory Visit: Payer: Self-pay

## 2019-06-13 ENCOUNTER — Encounter (HOSPITAL_BASED_OUTPATIENT_CLINIC_OR_DEPARTMENT_OTHER): Payer: Self-pay | Admitting: Emergency Medicine

## 2019-06-13 DIAGNOSIS — Z79899 Other long term (current) drug therapy: Secondary | ICD-10-CM | POA: Diagnosis not present

## 2019-06-13 DIAGNOSIS — J3489 Other specified disorders of nose and nasal sinuses: Secondary | ICD-10-CM | POA: Diagnosis not present

## 2019-06-13 DIAGNOSIS — R0981 Nasal congestion: Secondary | ICD-10-CM | POA: Insufficient documentation

## 2019-06-13 DIAGNOSIS — R110 Nausea: Secondary | ICD-10-CM | POA: Diagnosis not present

## 2019-06-13 DIAGNOSIS — R197 Diarrhea, unspecified: Secondary | ICD-10-CM | POA: Insufficient documentation

## 2019-06-13 DIAGNOSIS — Z20822 Contact with and (suspected) exposure to covid-19: Secondary | ICD-10-CM | POA: Diagnosis not present

## 2019-06-13 DIAGNOSIS — J45909 Unspecified asthma, uncomplicated: Secondary | ICD-10-CM | POA: Insufficient documentation

## 2019-06-13 DIAGNOSIS — Z87891 Personal history of nicotine dependence: Secondary | ICD-10-CM | POA: Diagnosis not present

## 2019-06-13 DIAGNOSIS — R05 Cough: Secondary | ICD-10-CM | POA: Insufficient documentation

## 2019-06-13 LAB — SARS CORONAVIRUS 2 BY RT PCR (HOSPITAL ORDER, PERFORMED IN ~~LOC~~ HOSPITAL LAB): SARS Coronavirus 2: NEGATIVE

## 2019-06-13 MED ORDER — ALBUTEROL SULFATE HFA 108 (90 BASE) MCG/ACT IN AERS
2.0000 | INHALATION_SPRAY | RESPIRATORY_TRACT | Status: DC | PRN
  Administered 2019-06-13: 2 via RESPIRATORY_TRACT
  Filled 2019-06-13: qty 6.7

## 2019-06-13 NOTE — ED Triage Notes (Addendum)
C/o feeling SOB ealier today, has asthma, took inhaler with relief. Denies SOB at present. Resp even, non labored. VSS. Also c/o nausea,. Denies pain. States had COVID exposure 1.5 week ago.

## 2019-06-13 NOTE — ED Provider Notes (Signed)
[facility] EMERGENCY DEPARTMENT Provider Note   CSN: [id number] Arrival date & time: 06/13/19  2041     History Chief Complaint  Patient presents with  . Shortness of Breath  . Abdominal Pain    Cameron Caldwell is a 24 y.o. male.  Patient is a 24 year old male with achondroplasia, asthma and back pain who is presenting today with URI symptoms.  Patient reports 3 days ago he started to get congestion, drainage, cough, chills, nausea and minimal diarrhea.  He will have an occasional wheeze but reports when he uses his inhaler the wheezing resolves.  He does report that he was recently on a trip with his dad and stepmother.  1-1/2 weeks ago.  His dad called him after they returned and he tested positive for Covid.  Patient has not been vaccinated.  He has no shortness of breath at this time.  He has had some cough but it has been dry.  He denies any urinary symptoms.  The history is provided by the patient.       Past Medical History:  Diagnosis Date  . Achondroplasia   . Asthma   . Back pain     There are no problems to display for this patient.   History reviewed. No pertinent surgical history.     No family history on file.  Social History   Tobacco Use  . Smoking status: Former Games developer  . Smokeless tobacco: Never Used  Substance Use Topics  . Alcohol use: Yes    Comment: occasional   . Drug use: No    Home Medications Prior to Admission medications   Medication Sig Start Date End Date Taking? Authorizing Provider  albuterol (PROVENTIL HFA;VENTOLIN HFA) 108 (90 Base) MCG/ACT inhaler Inhale 2 puffs into the lungs every 4 (four) hours as needed for wheezing or shortness of breath. 01/10/18   Law, Waylan Boga, PA-C  albuterol (PROVENTIL) (2.5 MG/3ML) 0.083% nebulizer solution Take 3 mLs (2.5 mg total) by nebulization every 6 (six) hours as needed for wheezing or shortness of breath. 04/17/18   Loren Racer, MD  naproxen (NAPROSYN) 500 MG tablet TK 1  T PO BID 03/27/18   [provider]  oxyCODONE-acetaminophen (PERCOCET/ROXICET) 5-325 MG tablet TK 1 T PO D 03/27/18   [provider]  predniSONE (DELTASONE) 10 MG tablet Take 2 tablets (20 mg total) by mouth 2 (two) times daily with a meal. 08/16/18   Geoffery Lyons, MD  QVAR REDIHALER 40 MCG/ACT inhaler INHALE 1 PUFF PO BID 10/28/17   [provider]    Allergies    Cat hair extract  Review of Systems   Review of Systems  All other systems reviewed and are negative.   Physical Exam Updated Vital Signs BP 127/78 (BP Location: Right Arm)   Pulse 96   Temp 98.9 F (37.2 C) (Oral)   Resp 20   Wt 59 kg   SpO2 95%   BMI 30.23 kg/m   Physical Exam Vitals and nursing note reviewed.  Constitutional:      General: He is not in acute distress.    Appearance: He is well-developed and normal weight.  HENT:     Head: Normocephalic and atraumatic.     Right Ear: Tympanic membrane normal.     Left Ear: Tympanic membrane normal.     Nose: Congestion and rhinorrhea present.     Mouth/Throat:     Mouth: Mucous membranes are moist.  Eyes:     Conjunctiva/sclera:  Conjunctivae normal.     Pupils: Pupils are equal, round, and reactive to light.  Cardiovascular:     Rate and Rhythm: Normal rate and regular rhythm.     Pulses: Normal pulses.     Heart sounds: No murmur.  Pulmonary:     Effort: Pulmonary effort is normal. No respiratory distress.     Breath sounds: Normal breath sounds. No wheezing or rales.  Abdominal:     General: There is no distension.     Palpations: Abdomen is soft.     Tenderness: There is no abdominal tenderness. There is no guarding or rebound.  Musculoskeletal:        General: No tenderness. Normal range of motion.     Cervical back: Normal range of motion and neck supple.  Skin:    General: Skin is warm and dry.     Findings: No erythema or rash.  Neurological:     Mental Status: He is alert and oriented to person, place, and time.    Psychiatric:        Mood and Affect: Mood normal.        Behavior: Behavior normal.        Thought Content: Thought content normal.     ED Results / Procedures / Treatments   Labs (all labs ordered are listed, but only abnormal results are displayed) Labs Reviewed  SARS CORONAVIRUS 2 BY RT PCR (HOSPITAL ORDER, [place] [facility])    EKG None  Radiology No results found.  Procedures Procedures (including critical care time)  Medications Ordered in ED Medications  albuterol (VENTOLIN HFA) 108 (90 Base) MCG/ACT inhaler 2 puff (2 puffs Inhalation Given 06/13/19 2148)    ED Course  I have reviewed the triage vital signs and the nursing notes.  Pertinent labs & imaging results that were available during my care of the patient were reviewed by me and considered in my medical decision making (see chart for details).    MDM Rules/Calculators/A&P                      Pt with symptoms consistent with viral URI vs COVID as pt has had close family contacts that tested positive and he has not been vaccinated.  Well appearing here.  No signs of breathing difficulty  No signs of pharyngitis, otitis or abnormal abdominal findings.  He does have a history of asthma and had some wheezing earlier but used his inhaler and is now clear and satting 95% on room air.  Patient is almost out of his head inhaler so we will give a new inhaler.  Covid test pending.  [place] J Correira was evaluated in Emergency Department on 06/13/2019 for the symptoms described in the history of present illness. He was evaluated in the context of the global COVID-19 pandemic, which necessitated consideration that the patient might be at risk for infection with the SARS-CoV-2 virus that causes COVID-19. Institutional protocols and algorithms that pertain to the evaluation of patients at risk for COVID-19 are in a state of rapid change based on information released by regulatory bodies including the CDC and  federal and state organizations. These policies and algorithms were followed during the patient's care in the ED.   Final Clinical Impression(s) / ED Diagnoses Final diagnoses:  Suspected COVID-19 virus infection    Rx / DC Orders ED Discharge Orders    None       Blanchie Dessert, MD 06/13/19 2159

## 2019-08-13 ENCOUNTER — Emergency Department (HOSPITAL_BASED_OUTPATIENT_CLINIC_OR_DEPARTMENT_OTHER): Payer: BC Managed Care – PPO

## 2019-08-13 ENCOUNTER — Other Ambulatory Visit: Payer: Self-pay

## 2019-08-13 ENCOUNTER — Encounter (HOSPITAL_BASED_OUTPATIENT_CLINIC_OR_DEPARTMENT_OTHER): Payer: Self-pay | Admitting: Emergency Medicine

## 2019-08-13 ENCOUNTER — Emergency Department (HOSPITAL_BASED_OUTPATIENT_CLINIC_OR_DEPARTMENT_OTHER)
Admission: EM | Admit: 2019-08-13 | Discharge: 2019-08-13 | Disposition: A | Discharge status: Home / Self Care | Payer: BC Managed Care – PPO | Attending: Emergency Medicine | Admitting: Emergency Medicine

## 2019-08-13 DIAGNOSIS — M549 Dorsalgia, unspecified: Secondary | ICD-10-CM | POA: Diagnosis not present

## 2019-08-13 DIAGNOSIS — J45909 Unspecified asthma, uncomplicated: Secondary | ICD-10-CM | POA: Diagnosis not present

## 2019-08-13 DIAGNOSIS — Z20822 Contact with and (suspected) exposure to covid-19: Secondary | ICD-10-CM | POA: Insufficient documentation

## 2019-08-13 DIAGNOSIS — R05 Cough: Secondary | ICD-10-CM | POA: Diagnosis not present

## 2019-08-13 DIAGNOSIS — Z87891 Personal history of nicotine dependence: Secondary | ICD-10-CM | POA: Diagnosis not present

## 2019-08-13 DIAGNOSIS — J029 Acute pharyngitis, unspecified: Secondary | ICD-10-CM | POA: Insufficient documentation

## 2019-08-13 DIAGNOSIS — Q774 Achondroplasia: Secondary | ICD-10-CM | POA: Diagnosis not present

## 2019-08-13 DIAGNOSIS — R0981 Nasal congestion: Secondary | ICD-10-CM | POA: Diagnosis not present

## 2019-08-13 LAB — GROUP A STREP BY PCR: Group A Strep by PCR: NOT DETECTED

## 2019-08-13 LAB — SARS CORONAVIRUS 2 BY RT PCR (HOSPITAL ORDER, PERFORMED IN ~~LOC~~ HOSPITAL LAB): SARS Coronavirus 2: NEGATIVE

## 2019-08-13 MED ORDER — PREDNISONE 10 MG PO TABS: 40.0000 mg | ORAL_TABLET | Freq: Every day | ORAL | 0 refills | Status: AC

## 2019-08-13 MED ORDER — FLUTICASONE PROPIONATE 50 MCG/ACT NA SUSP: 1.0000 | Freq: Every day | NASAL | 0 refills | Status: AC

## 2019-08-13 NOTE — Discharge Instructions (Addendum)
You were seen in the ER today for a sore throat and asthma problems  Your strep test & covid testing were negative.  Your chest xray was normal.   We are sending you home with flonase to use 1 spray per nostril daily to help with nasal congestion/post nasal drip and prednisone to take for 5 days for asthma flare & for help with pain in your throat.  Please take these as prescribed.   We have prescribed you new medication(s) today. Discuss the medications prescribed today with your pharmacist as they can have adverse effects and interactions with your other medicines including over the counter and prescribed medications. Seek medical evaluation if you start to experience new or abnormal symptoms after taking one of these medicines, seek care immediately if you start to experience difficulty breathing, feeling of your throat closing, facial swelling, or rash as these could be indications of a more serious allergic reaction  Continue to use your inhaler as needed.   Follow up with primary care in 1 week. Return to the ER for new or worsening symptoms including but not limited to worsened pain, trouble breathing, fever, or any other concerns.

## 2019-08-13 NOTE — ED Triage Notes (Signed)
Sore throat and "my asthma has been acting up" for a couple days.

## 2019-08-13 NOTE — ED Provider Notes (Signed)
[facility] EMERGENCY DEPARTMENT Provider Note   CSN: [id number] Arrival date & time: 08/13/19  0935     History Chief Complaint  Patient presents with  . Sore Throat    Cameron Caldwell is a 24 y.o. male with a history of asthma who presents to the ED with complaints of sore throat & asthma flare x 3 days. Patient states pain is to the throat, constant, progressively worsening, no specific alleviating/aggravating factors. With this he has had some nasal congestion and sxs which he feels are an asthma flare including wheezing & mild cough, using albuterol inhaler with some improvement of this- used just PTA. Denies fever, chills, ear pain, dyspnea, chest pain, or abdominal pain. Has had recent covid 19 exposure and has not been vaccinated.   HPI     Past Medical History:  Diagnosis Date  . Achondroplasia   . Asthma   . Back pain     There are no problems to display for this patient.   History reviewed. No pertinent surgical history.     History reviewed. No pertinent family history.  Social History   Tobacco Use  . Smoking status: Former Games developer  . Smokeless tobacco: Never Used  Vaping Use  . Vaping Use: Every day  Substance Use Topics  . Alcohol use: Yes    Comment: occasional   . Drug use: No    Home Medications Prior to Admission medications   Medication Sig Start Date End Date Taking? Authorizing Provider  albuterol (PROVENTIL HFA;VENTOLIN HFA) 108 (90 Base) MCG/ACT inhaler Inhale 2 puffs into the lungs every 4 (four) hours as needed for wheezing or shortness of breath. 01/10/18   Law, Waylan Boga, PA-C  albuterol (PROVENTIL) (2.5 MG/3ML) 0.083% nebulizer solution Take 3 mLs (2.5 mg total) by nebulization every 6 (six) hours as needed for wheezing or shortness of breath. 04/17/18   Loren Racer, MD  naproxen (NAPROSYN) 500 MG tablet TK 1 T PO BID 03/27/18   [provider]  oxyCODONE-acetaminophen (PERCOCET/ROXICET) 5-325 MG tablet TK 1 T  PO D 03/27/18   [provider]  predniSONE (DELTASONE) 10 MG tablet Take 2 tablets (20 mg total) by mouth 2 (two) times daily with a meal. 08/16/18   Geoffery Lyons, MD  QVAR REDIHALER 40 MCG/ACT inhaler INHALE 1 PUFF PO BID 10/28/17   [provider]    Allergies    Cat hair extract  Review of Systems   Review of Systems  Constitutional: Negative for chills and fever.  HENT: Positive for congestion and sore throat. Negative for ear pain, trouble swallowing and voice change.   Respiratory: Positive for cough and wheezing. Negative for shortness of breath.   Cardiovascular: Negative for chest pain and leg swelling.  Gastrointestinal: Negative for abdominal pain and vomiting.  Neurological: Negative for syncope.  All other systems reviewed and are negative.   Physical Exam Updated Vital Signs BP 124/80 (BP Location: Right Arm)   Pulse 83   Temp 98.2 F (36.8 C) (Oral)   Resp 16   Ht 4\' 7"  (1.397 m)   Wt 62.5 kg   SpO2 99%   BMI 32.03 kg/m   Physical Exam Vitals and nursing note reviewed.  Constitutional:      General: He is not in acute distress.    Appearance: He is well-developed. He is not toxic-appearing.  HENT:     Head: Normocephalic and atraumatic.     Right Ear: Ear canal normal. Tympanic membrane is not  perforated, erythematous, retracted or bulging.     Left Ear: Ear canal normal. Tympanic membrane is not perforated, erythematous, retracted or bulging.     Ears:     Comments: No mastoid erythema/swellng/tenderness.     Nose:     Right Sinus: No maxillary sinus tenderness or frontal sinus tenderness.     Left Sinus: No maxillary sinus tenderness or frontal sinus tenderness.     Mouth/Throat:     Pharynx: Oropharynx is clear. Uvula midline. Posterior oropharyngeal erythema present. No oropharyngeal exudate.     Comments: Posterior oropharynx is symmetric appearing. Patient tolerating own secretions without difficulty. No trismus. No drooling. No  hot potato voice. No swelling beneath the tongue, submandibular compartment is soft.  Eyes:     General:        Right eye: No discharge.        Left eye: No discharge.     Conjunctiva/sclera: Conjunctivae normal.  Cardiovascular:     Rate and Rhythm: Normal rate and regular rhythm.  Pulmonary:     Effort: Pulmonary effort is normal. No respiratory distress.     Breath sounds: Normal breath sounds. No wheezing, rhonchi or rales.  Abdominal:     General: There is no distension.     Palpations: Abdomen is soft.     Tenderness: There is no abdominal tenderness.  Musculoskeletal:     Cervical back: Neck supple. No rigidity.  Lymphadenopathy:     Cervical: No cervical adenopathy.  Skin:    General: Skin is warm and dry.     Findings: No rash.  Neurological:     Mental Status: He is alert.  Psychiatric:        Behavior: Behavior normal.     ED Results / Procedures / Treatments   Labs (all labs ordered are listed, but only abnormal results are displayed) Labs Reviewed  GROUP A STREP BY PCR  SARS CORONAVIRUS 2 BY RT PCR (HOSPITAL ORDER, PERFORMED IN [facility])    EKG None  Radiology DG Chest Portable 1 View  Result Date: 08/13/2019 CLINICAL DATA:  Cough EXAM: PORTABLE CHEST 1 VIEW COMPARISON:  2018 FINDINGS: The heart size and mediastinal contours are within normal limits. Both lungs are clear. No pleural effusion. The visualized skeletal structures are unremarkable. IMPRESSION: No acute process in the chest. Electronically Signed   By: Guadlupe Spanish M.D.   On: 08/13/2019 10:23    Procedures Procedures (including critical care time)  Medications Ordered in ED Medications - No data to display  ED Course  I have reviewed the triage vital signs and the nursing notes.  Pertinent labs & imaging results that were available during my care of the patient were reviewed by me and considered in my medical decision making (see chart for details).  Cameron J Browning  was evaluated in Emergency Department on 08/13/2019 for the symptoms described in the history of present illness. He/she was evaluated in the context of the global COVID-19 pandemic, which necessitated consideration that the patient might be at risk for infection with the SARS-CoV-2 virus that causes COVID-19. Institutional protocols and algorithms that pertain to the evaluation of patients at risk for COVID-19 are in a state of rapid change based on information released by regulatory bodies including the CDC and federal and state organizations. These policies and algorithms were followed during the patient's care in the ED.    MDM Rules/Calculators/A&P  Patient presents to the ED with complaints of sore throat and sxs he feels are consistent w/ an asthma flare. He is nontoxic, resting comfortably, vitals WNL. Strep test negative, exam not consistent w/ RPA/PTA. sxs < 7 days, afebrile, doubt acute bacterial sinusitis. No signs of AOM, AOE, or mastoiditis on exam. No meningismus. CXR w/o infiltrate to suggest pneumonia. Lungs clear in the ED, however used inhaler just PTA, he feels like when he has needed steroids in the past, will treat with burst. Does not appear to be in respiratory distress or hypoxic to require hospital admission for asthma exacerbation. COVID negative. I discussed results, treatment plan, need for follow-up, and return precautions with the patient. Provided opportunity for questions, patient confirmed understanding and is in agreement with plan.   Final Clinical Impression(s) / ED Diagnoses Final diagnoses:  Sore throat    Rx / DC Orders ED Discharge Orders         Ordered    fluticasone (FLONASE) 50 MCG/ACT nasal spray  Daily     Discontinue  Reprint     08/13/19 1159    predniSONE (DELTASONE) 10 MG tablet  Daily     Discontinue  Reprint     08/13/19 [address], [place] R, PA-C 08/13/19 1204    Little, Ambrose Finland,  MD 08/13/19 1235

## 2020-05-23 ENCOUNTER — Emergency Department (HOSPITAL_BASED_OUTPATIENT_CLINIC_OR_DEPARTMENT_OTHER)
Admission: EM | Admit: 2020-05-23 | Discharge: 2020-05-23 | Disposition: A | Discharge status: Home / Self Care | Payer: BC Managed Care – PPO | Attending: Emergency Medicine | Admitting: Emergency Medicine

## 2020-05-23 ENCOUNTER — Other Ambulatory Visit: Payer: Self-pay

## 2020-05-23 ENCOUNTER — Encounter (HOSPITAL_BASED_OUTPATIENT_CLINIC_OR_DEPARTMENT_OTHER): Payer: Self-pay | Admitting: *Deleted

## 2020-05-23 DIAGNOSIS — Z7951 Long term (current) use of inhaled steroids: Secondary | ICD-10-CM | POA: Insufficient documentation

## 2020-05-23 DIAGNOSIS — J45909 Unspecified asthma, uncomplicated: Secondary | ICD-10-CM | POA: Diagnosis not present

## 2020-05-23 DIAGNOSIS — Z87891 Personal history of nicotine dependence: Secondary | ICD-10-CM | POA: Insufficient documentation

## 2020-05-23 MED ORDER — ALBUTEROL SULFATE (2.5 MG/3ML) 0.083% IN NEBU: 2.5000 mg | INHALATION_SOLUTION | Freq: Four times a day (QID) | RESPIRATORY_TRACT | 3 refills | Status: DC | PRN

## 2020-05-23 MED ORDER — PREDNISONE 50 MG PO TABS
60.0000 mg | ORAL_TABLET | Freq: Once | ORAL | Status: AC
  Administered 2020-05-23: 60 mg via ORAL
  Filled 2020-05-23: qty 1

## 2020-05-23 MED ORDER — ALBUTEROL SULFATE (2.5 MG/3ML) 0.083% IN NEBU: 2.5000 mg | INHALATION_SOLUTION | Freq: Four times a day (QID) | RESPIRATORY_TRACT | 3 refills | Status: AC | PRN

## 2020-05-23 MED ORDER — PREDNISONE 20 MG PO TABS: 40.0000 mg | ORAL_TABLET | Freq: Every day | ORAL | 0 refills | Status: DC

## 2020-05-23 MED ORDER — ALBUTEROL SULFATE HFA 108 (90 BASE) MCG/ACT IN AERS
2.0000 | INHALATION_SPRAY | Freq: Once | RESPIRATORY_TRACT | Status: AC
  Administered 2020-05-23: 2 via RESPIRATORY_TRACT
  Filled 2020-05-23: qty 6.7

## 2020-05-23 MED ORDER — ALBUTEROL SULFATE HFA 108 (90 BASE) MCG/ACT IN AERS: 2.0000 | INHALATION_SPRAY | RESPIRATORY_TRACT | 0 refills | Status: AC | PRN

## 2020-05-23 NOTE — ED Triage Notes (Signed)
C/o SOb x 3 days , Hx asthma no relief with inhalers

## 2020-05-23 NOTE — ED Provider Notes (Signed)
[facility] EMERGENCY DEPARTMENT Provider Note   CSN: [id number] Arrival date & time: 05/23/20  0034     History Chief Complaint  Patient presents with  . Asthma    Cameron Caldwell is a 25 y.o. male.  Patient presents to the emergency department for evaluation of wheezing.  Patient has a history of asthma.  He has been using his inhaler but for the last 3 days his wheezing has not fully responded.  He reports that he does have significant problems with pollen and his allergies have been bad over the last few days.  He normally uses Claritin and/or Benadryl for his allergies.  He has not had any fever or cough.  No other infectious symptoms.        Past Medical History:  Diagnosis Date  . Achondroplasia   . Asthma   . Back pain     There are no problems to display for this patient.   History reviewed. No pertinent surgical history.     No family history on file.  Social History   Tobacco Use  . Smoking status: Former Games developer  . Smokeless tobacco: Never Used  Vaping Use  . Vaping Use: Every day  Substance Use Topics  . Alcohol use: Yes    Comment: occasional   . Drug use: No    Home Medications Prior to Admission medications   Medication Sig Start Date End Date Taking? Authorizing Provider  predniSONE (DELTASONE) 20 MG tablet Take 2 tablets (40 mg total) by mouth daily with breakfast. 05/23/20  Yes Braylee Lal, Canary Brim, MD  albuterol (PROVENTIL) (2.5 MG/3ML) 0.083% nebulizer solution Take 3 mLs (2.5 mg total) by nebulization every 6 (six) hours as needed for wheezing or shortness of breath. 05/23/20   Gilda Crease, MD  albuterol (VENTOLIN HFA) 108 (90 Base) MCG/ACT inhaler Inhale 2 puffs into the lungs every 4 (four) hours as needed for wheezing or shortness of breath. 05/23/20   Gilda Crease, MD  fluticasone (FLONASE) 50 MCG/ACT nasal spray Place 1 spray into both nostrils daily. 08/13/19   Petrucelli, Samantha R, PA-C  naproxen  (NAPROSYN) 500 MG tablet TK 1 T PO BID 03/27/18   [provider]  oxyCODONE-acetaminophen (PERCOCET/ROXICET) 5-325 MG tablet TK 1 T PO D 03/27/18   [provider]  QVAR REDIHALER 40 MCG/ACT inhaler INHALE 1 PUFF PO BID 10/28/17   [provider]    Allergies    Cat hair extract  Review of Systems   Review of Systems  Respiratory: Positive for wheezing.   All other systems reviewed and are negative.   Physical Exam Updated Vital Signs BP 123/80 (BP Location: Left Arm)   Pulse 86   Temp 98.1 F (36.7 C) (Oral)   Resp 18   Ht 4\' 7"  (1.397 m)   Wt 59.4 kg   SpO2 97%   BMI 30.45 kg/m   Physical Exam Vitals and nursing note reviewed.  Constitutional:      General: He is not in acute distress.    Appearance: Normal appearance. He is well-developed.  HENT:     Head: Normocephalic and atraumatic.     Right Ear: Hearing normal.     Left Ear: Hearing normal.     Nose: Nose normal.  Eyes:     Conjunctiva/sclera: Conjunctivae normal.     Pupils: Pupils are equal, round, and reactive to light.  Cardiovascular:     Rate and Rhythm: Regular rhythm.  Heart sounds: S1 normal and S2 normal. No murmur heard. No friction rub. No gallop.   Pulmonary:     Effort: Pulmonary effort is normal. No respiratory distress.     Breath sounds: Normal breath sounds.  Chest:     Chest wall: No tenderness.  Abdominal:     General: Bowel sounds are normal.     Palpations: Abdomen is soft.     Tenderness: There is no abdominal tenderness. There is no guarding or rebound. Negative signs include Murphy's sign and McBurney's sign.     Hernia: No hernia is present.  Musculoskeletal:        General: Normal range of motion.     Cervical back: Normal range of motion and neck supple.  Skin:    General: Skin is warm and dry.     Findings: No rash.  Neurological:     Mental Status: He is alert and oriented to person, place, and time.     GCS: GCS eye subscore is 4. GCS  verbal subscore is 5. GCS motor subscore is 6.     Cranial Nerves: No cranial nerve deficit.     Sensory: No sensory deficit.     Coordination: Coordination normal.  Psychiatric:        Speech: Speech normal.        Behavior: Behavior normal.        Thought Content: Thought content normal.     ED Results / Procedures / Treatments   Labs (all labs ordered are listed, but only abnormal results are displayed) Labs Reviewed - No data to display  EKG None  Radiology No results found.  Procedures Procedures   Medications Ordered in ED Medications  predniSONE (DELTASONE) tablet 60 mg (has no administration in time range)    ED Course  I have reviewed the triage vital signs and the nursing notes.  Pertinent labs & imaging results that were available during my care of the patient were reviewed by me and considered in my medical decision making (see chart for details).    MDM Rules/Calculators/A&P                          Patient appears well.  He is not in any respiratory distress.  Lungs are clear currently, no wheezing and no clinical concern for pneumonia.  He likely has some inflammatory component and will require steroid treatment.  We will refill his albuterol as well.  Does not require additional bronchodilator therapy at this time as he is not wheezing.  Final Clinical Impression(s) / ED Diagnoses Final diagnoses:  Asthma due to seasonal allergies    Rx / DC Orders ED Discharge Orders         Ordered    predniSONE (DELTASONE) 20 MG tablet  Daily with breakfast        05/23/20 0112    albuterol (VENTOLIN HFA) 108 (90 Base) MCG/ACT inhaler  Every 4 hours PRN        05/23/20 0112    albuterol (PROVENTIL) (2.5 MG/3ML) 0.083% nebulizer solution  Every 6 hours PRN        05/23/20 0112           Gilda Crease, MD 05/23/20 [phone]

## 2020-06-10 ENCOUNTER — Encounter (HOSPITAL_BASED_OUTPATIENT_CLINIC_OR_DEPARTMENT_OTHER): Payer: Self-pay | Admitting: Emergency Medicine

## 2020-06-10 ENCOUNTER — Emergency Department (HOSPITAL_BASED_OUTPATIENT_CLINIC_OR_DEPARTMENT_OTHER)
Admission: EM | Admit: 2020-06-10 | Discharge: 2020-06-10 | Disposition: A | Discharge status: Home / Self Care | Payer: BC Managed Care – PPO | Attending: Emergency Medicine | Admitting: Emergency Medicine

## 2020-06-10 ENCOUNTER — Emergency Department (HOSPITAL_BASED_OUTPATIENT_CLINIC_OR_DEPARTMENT_OTHER): Payer: BC Managed Care – PPO

## 2020-06-10 ENCOUNTER — Other Ambulatory Visit: Payer: Self-pay

## 2020-06-10 DIAGNOSIS — J45909 Unspecified asthma, uncomplicated: Secondary | ICD-10-CM | POA: Insufficient documentation

## 2020-06-10 DIAGNOSIS — Z87891 Personal history of nicotine dependence: Secondary | ICD-10-CM | POA: Diagnosis not present

## 2020-06-10 DIAGNOSIS — M79602 Pain in left arm: Secondary | ICD-10-CM | POA: Insufficient documentation

## 2020-06-10 DIAGNOSIS — Z7951 Long term (current) use of inhaled steroids: Secondary | ICD-10-CM | POA: Insufficient documentation

## 2020-06-10 DIAGNOSIS — R079 Chest pain, unspecified: Secondary | ICD-10-CM | POA: Diagnosis present

## 2020-06-10 DIAGNOSIS — F419 Anxiety disorder, unspecified: Secondary | ICD-10-CM

## 2020-06-10 LAB — CBC WITH DIFFERENTIAL/PLATELET
Abs Immature Granulocytes: 0.01 10*3/uL (ref 0.00–0.07)
Basophils Absolute: 0 10*3/uL (ref 0.0–0.1)
Basophils Relative: 1 %
Eosinophils Absolute: 0.2 10*3/uL (ref 0.0–0.5)
Eosinophils Relative: 6 %
HCT: 40 % (ref 39.0–52.0)
Hemoglobin: 14.1 g/dL (ref 13.0–17.0)
Immature Granulocytes: 0 %
Lymphocytes Relative: 45 %
Lymphs Abs: 1.4 10*3/uL (ref 0.7–4.0)
MCH: 29.7 pg (ref 26.0–34.0)
MCHC: 35.3 g/dL (ref 30.0–36.0)
MCV: 84.4 fL (ref 80.0–100.0)
Monocytes Absolute: 0.3 10*3/uL (ref 0.1–1.0)
Monocytes Relative: 10 %
Neutro Abs: 1.3 10*3/uL — ABNORMAL LOW (ref 1.7–7.7)
Neutrophils Relative %: 38 %
Platelets: 159 10*3/uL (ref 150–400)
RBC: 4.74 MIL/uL (ref 4.22–5.81)
RDW: 11.9 % (ref 11.5–15.5)
WBC: 3.3 10*3/uL — ABNORMAL LOW (ref 4.0–10.5)
nRBC: 0 % (ref 0.0–0.2)

## 2020-06-10 LAB — BASIC METABOLIC PANEL
Anion gap: 6 (ref 5–15)
BUN: 14 mg/dL (ref 6–20)
CO2: 24 mmol/L (ref 22–32)
Calcium: 9.2 mg/dL (ref 8.9–10.3)
Chloride: 105 mmol/L (ref 98–111)
Creatinine, Ser: 0.78 mg/dL (ref 0.61–1.24)
GFR, Estimated: >60 mL/min (ref >60)
Glucose, Bld: 98 mg/dL (ref 70–99)
Potassium: 3.9 mmol/L (ref 3.5–5.1)
Sodium: 135 mmol/L (ref 135–145)

## 2020-06-10 LAB — TROPONIN I (HIGH SENSITIVITY)
Troponin I (High Sensitivity): <2 ng/L (ref <18)
Troponin I (High Sensitivity): 2 ng/L (ref <18)

## 2020-06-10 MED ORDER — HYDROXYZINE HCL 25 MG PO TABS: 25.0000 mg | ORAL_TABLET | Freq: Three times a day (TID) | ORAL | 0 refills | Status: AC | PRN

## 2020-06-10 NOTE — Discharge Instructions (Addendum)
Your work-up here in the ED was reassuring.  The cause of your left-sided chest discomfort is nonspecific, but based on your reported increased anxiety and stress revolving your back pain may have been contributing factors.  I have provided you with a referral to cardiology for ongoing evaluation and management.  Please notify your primary care provider of today's ED encounter.  Have also prescribed you Vistaril which she can take as needed if you feel as though you are having an anxiety attack.  Please note that this can make you particularly drowsy and you would not be able to drive for continue working after taking this medication.  Please return to the ED or seek immediate medical attention should you experience any new or worsening symptoms.

## 2020-06-10 NOTE — ED Provider Notes (Signed)
[facility] EMERGENCY DEPARTMENT Provider Note   CSN: [id number] Arrival date & time: 06/10/20  2725     History Chief Complaint  Patient presents with  . Chest Pain    Cameron Caldwell is a 25 y.o. male with past medical history significant for achondroplasia who presents the ED with complaints of left-sided chest pain with radiation to left arm x1 hour with associated shortness of breath.  On my examination, patient tells me that he works a physically demanding job lifting boxes.  He states that he was at work, however was sitting down, when he developed left-sided chest pain described as somebody sitting on him.  It lasted approximately 10 to 15 minutes before resolving.  He then developed a squeezing sensation around his left arm.  His symptoms were associated with shortness of breath described as "not getting enough oxygen".  He states that he used his albuterol inhaler that he typically uses for his asthma symptoms, without any relief.  He is chest pain-free on my examination and states that his shortness of breath symptoms have improved.  Patient reports that he has never experienced symptoms quite like this before.  He takes prescribed narcotic medications, but denies any other illicit drugs.  He smokes vape cigarettes occasionally, not daily.  He denies any associated diaphoresis or nausea symptoms, recent fevers or chills, history of clots or clotting disorder, unilateral extremity swelling or edema, cough, abdominal pain, syncope, or other symptoms.  Patient is accompanied by his fiance was at bedside.  He admits that he has been feeling significantly more stressed and anxious recently.  He used to be treated when he was younger around the age of 14, but since has not required any anxiety management.  HPI     Past Medical History:  Diagnosis Date  . Achondroplasia   . Asthma   . Back pain     There are no problems to display for this patient.   No past  surgical history on file.     No family history on file.  Social History   Tobacco Use  . Smoking status: Former Games developer  . Smokeless tobacco: Never Used  Vaping Use  . Vaping Use: Some days  Substance Use Topics  . Alcohol use: Yes    Comment: occasional   . Drug use: No    Home Medications Prior to Admission medications   Medication Sig Start Date End Date Taking? Authorizing Provider  hydrOXYzine (ATARAX/VISTARIL) 25 MG tablet Take 1 tablet (25 mg total) by mouth every 8 (eight) hours as needed for anxiety. 06/10/20  Yes Lorelee New, PA-C  albuterol (PROVENTIL) (2.5 MG/3ML) 0.083% nebulizer solution Take 3 mLs (2.5 mg total) by nebulization every 6 (six) hours as needed for wheezing or shortness of breath. 05/23/20   Gilda Crease, MD  albuterol (VENTOLIN HFA) 108 (90 Base) MCG/ACT inhaler Inhale 2 puffs into the lungs every 4 (four) hours as needed for wheezing or shortness of breath. 05/23/20   Gilda Crease, MD  fluticasone (FLONASE) 50 MCG/ACT nasal spray Place 1 spray into both nostrils daily. 08/13/19   Petrucelli, Samantha R, PA-C  naproxen (NAPROSYN) 500 MG tablet TK 1 T PO BID 03/27/18   [provider]  oxyCODONE-acetaminophen (PERCOCET/ROXICET) 5-325 MG tablet TK 1 T PO D 03/27/18   [provider]  predniSONE (DELTASONE) 20 MG tablet Take 2 tablets (40 mg total) by mouth daily with breakfast. 05/23/20   Pollina, Canary Brim, MD  QVAR  REDIHALER 40 MCG/ACT inhaler INHALE 1 PUFF PO BID 10/28/17   [provider]    Allergies    Cat hair extract and Amoxicillin  Review of Systems   Review of Systems  All other systems reviewed and are negative.   Physical Exam Updated Vital Signs BP 99/60   Pulse 69   Temp 98.7 F (37.1 C) (Oral)   Resp 18   Ht 4\' 7"  (1.397 m)   Wt 59 kg   SpO2 100%   BMI 30.21 kg/m   Physical Exam Vitals and nursing note reviewed. Exam conducted with a chaperone present.  Constitutional:       General: He is not in acute distress.    Appearance: Normal appearance. He is not toxic-appearing.  HENT:     Head: Normocephalic and atraumatic.  Eyes:     General: No scleral icterus.    Conjunctiva/sclera: Conjunctivae normal.  Cardiovascular:     Rate and Rhythm: Normal rate and regular rhythm.     Pulses: Normal pulses.     Heart sounds: Normal heart sounds.  Pulmonary:     Effort: Pulmonary effort is normal. No respiratory distress.     Comments: CTA bilaterally.  No increased work of breathing. Abdominal:     General: Abdomen is flat. There is no distension.     Palpations: Abdomen is soft.     Tenderness: There is no abdominal tenderness.  Musculoskeletal:        General: Normal range of motion.     Cervical back: Normal range of motion. No rigidity.     Right lower leg: No edema.     Left lower leg: No edema.  Skin:    General: Skin is dry.     Capillary Refill: Capillary refill takes less than 2 seconds.  Neurological:     Mental Status: He is alert and oriented to person, place, and time.     GCS: GCS eye subscore is 4. GCS verbal subscore is 5. GCS motor subscore is 6.  Psychiatric:        Mood and Affect: Mood normal.        Behavior: Behavior normal.        Thought Content: Thought content normal.     ED Results / Procedures / Treatments   Labs (all labs ordered are listed, but only abnormal results are displayed) Labs Reviewed  CBC WITH DIFFERENTIAL/PLATELET - Abnormal; Notable for the following components:      Result Value   WBC 3.3 (*)    Neutro Abs 1.3 (*)    All other components within normal limits  BASIC METABOLIC PANEL  TROPONIN I (HIGH SENSITIVITY)  TROPONIN I (HIGH SENSITIVITY)    EKG EKG Interpretation  Date/Time:  Tuesday Jun 10 2020 10:16:57 EDT Ventricular Rate:  81 PR Interval:  154 QRS Duration: 90 QT Interval:  328 QTC Calculation: 381 R Axis:   85 Text Interpretation: Normal sinus rhythm Normal ECG Confirmed by [birth year]) on 06/10/2020 10:23:18 AM   Radiology DG Chest Portable 1 View  Result Date: 06/10/2020 CLINICAL DATA:  cp, sob EXAM: PORTABLE CHEST 1 VIEW COMPARISON:  August 13, 2019. FINDINGS: The heart size and mediastinal contours are within normal limits. No focal consolidation. No visible pleural effusion or pneumothorax. The visualized skeletal structures are unremarkable. IMPRESSION: No acute cardiopulmonary findings. Electronically Signed   By: August 15, 2019 MD   On: 06/10/2020 11:45    Procedures Procedures   Medications Ordered in  ED Medications - No data to display  ED Course  I have reviewed the triage vital signs and the nursing notes.  Pertinent labs & imaging results that were available during my care of the patient were reviewed by me and considered in my medical decision making (see chart for details).    MDM Rules/Calculators/A&P                          Cameron Caldwell was evaluated in Emergency Department on 06/10/2020 for the symptoms described in the history of present illness. He was evaluated in the context of the global COVID-19 pandemic, which necessitated consideration that the patient might be at risk for infection with the SARS-CoV-2 virus that causes COVID-19. Institutional protocols and algorithms that pertain to the evaluation of patients at risk for COVID-19 are in a state of rapid change based on information released by regulatory bodies including the CDC and federal and state organizations. These policies and algorithms were followed during the patient's care in the ED.  I personally reviewed patient's medical chart and all notes from triage and staff during today's encounter. I have also ordered and reviewed all labs and imaging that I felt to be medically necessary in the evaluation of this patient's complaints and with consideration of their physical exam. If needed, translation services were available and utilized.   Individuals with achondroplasia suffer  from increased rates of cardiovascular disease relative to remainder of the population.  While it would still be unusual at his age, will proceed with chest pain work-up.  He does state that he has been under a significant amount of anxiety and stress and that his symptoms have improved entirely at the time of my evaluation.  Patient is presenting for chest pain.  Comprehensive work-up obtained to assess for cause of symptoms.  EKG without evidence of STEMI.  Troponin is negative, lowering concern for NSTEMI.  Patient has a low Heart Score which lowers suspicion for ACS.  I have low suspicion for dissection given normal pulses in extremities and normal mediastinum on CXR.  I reviewed DG Chest and there is no evidence of pneumothorax or consolidation concerning for pneumonia.  There is also no pleural effusion on x-ray or history suggestive of possible esophageal rupture.  Patient is PERC negative, low risk for PE per [place] Criteria.  I discussed the patient the exact etiology of their chest pain is unclear and warrants follow up with a primary care provider. Also discussed that while their risk for ACS is low, it is not completely eliminated and I discussed with patient specific warning signs and return precautions.  Patient to follow-up with his primary care provider for ongoing evaluation and management.  He has not had any recurrent chest discomfort symptoms while here in the ED.  He thinks that he simply had a panic attack/anxiety regarding his back pain for which she takes oxycodone.  We will discharge him home with a short course of hydroxyzine to take as needed for symptoms consistent with possible anxiety attack.  We will also provide referral to cardiology.   Final Clinical Impression(s) / ED Diagnoses Final diagnoses:  Nonspecific chest pain  Anxiety    Rx / DC Orders ED Discharge Orders         Ordered    hydrOXYzine (ATARAX/VISTARIL) 25 MG tablet  Every 8 hours PRN        06/10/20 1347  Lorelee New, PA-C 06/10/20 1348    Virgina Norfolk, DO 06/10/20 1352

## 2020-06-10 NOTE — ED Notes (Signed)
Pt aware lab will be obtained at 1230.

## 2020-06-10 NOTE — ED Triage Notes (Signed)
Pt arrives pov with c/o left side CP with radiation to L arm x 1 hr pta. Pt also reports feeling shob of breath, "I feel like my oxygen is low". Pt sats 100% on room air. Pt endorses rx pain meds 1.5 hrs pta

## 2021-01-21 ENCOUNTER — Encounter (HOSPITAL_BASED_OUTPATIENT_CLINIC_OR_DEPARTMENT_OTHER): Payer: Self-pay | Admitting: *Deleted

## 2021-01-21 ENCOUNTER — Other Ambulatory Visit: Payer: Self-pay

## 2021-01-21 ENCOUNTER — Emergency Department (HOSPITAL_BASED_OUTPATIENT_CLINIC_OR_DEPARTMENT_OTHER): Payer: BC Managed Care – PPO

## 2021-01-21 ENCOUNTER — Emergency Department (HOSPITAL_BASED_OUTPATIENT_CLINIC_OR_DEPARTMENT_OTHER)
Admission: EM | Admit: 2021-01-21 | Discharge: 2021-01-21 | Disposition: A | Discharge status: Home / Self Care | Payer: BC Managed Care – PPO | Attending: Emergency Medicine | Admitting: Emergency Medicine

## 2021-01-21 DIAGNOSIS — J45909 Unspecified asthma, uncomplicated: Secondary | ICD-10-CM | POA: Diagnosis not present

## 2021-01-21 DIAGNOSIS — R079 Chest pain, unspecified: Secondary | ICD-10-CM | POA: Diagnosis present

## 2021-01-21 HISTORY — DX: Unspecified asthma with (acute) exacerbation: J45.901

## 2021-01-21 LAB — CBC WITH DIFFERENTIAL/PLATELET
Abs Immature Granulocytes: 0.01 10*3/uL (ref 0.00–0.07)
Basophils Absolute: 0 10*3/uL (ref 0.0–0.1)
Basophils Relative: 1 %
Eosinophils Absolute: 0.2 10*3/uL (ref 0.0–0.5)
Eosinophils Relative: 4 %
HCT: 43 % (ref 39.0–52.0)
Hemoglobin: 15.2 g/dL (ref 13.0–17.0)
Immature Granulocytes: 0 %
Lymphocytes Relative: 41 %
Lymphs Abs: 1.7 10*3/uL (ref 0.7–4.0)
MCH: 29.6 pg (ref 26.0–34.0)
MCHC: 35.3 g/dL (ref 30.0–36.0)
MCV: 83.7 fL (ref 80.0–100.0)
Monocytes Absolute: 0.4 10*3/uL (ref 0.1–1.0)
Monocytes Relative: 10 %
Neutro Abs: 1.9 10*3/uL (ref 1.7–7.7)
Neutrophils Relative %: 44 %
Platelets: 178 10*3/uL (ref 150–400)
RBC: 5.14 MIL/uL (ref 4.22–5.81)
RDW: 12.1 % (ref 11.5–15.5)
WBC: 4.2 10*3/uL (ref 4.0–10.5)
nRBC: 0 % (ref 0.0–0.2)

## 2021-01-21 LAB — COMPREHENSIVE METABOLIC PANEL
ALT: 26 U/L (ref 0–44)
AST: 23 U/L (ref 15–41)
Albumin: 4.3 g/dL (ref 3.5–5.0)
Alkaline Phosphatase: 47 U/L (ref 38–126)
Anion gap: 7 (ref 5–15)
BUN: 13 mg/dL (ref 6–20)
CO2: 25 mmol/L (ref 22–32)
Calcium: 9.3 mg/dL (ref 8.9–10.3)
Chloride: 103 mmol/L (ref 98–111)
Creatinine, Ser: 0.84 mg/dL (ref 0.61–1.24)
GFR, Estimated: >60 mL/min (ref >60)
Glucose, Bld: 88 mg/dL (ref 70–99)
Potassium: 4 mmol/L (ref 3.5–5.1)
Sodium: 135 mmol/L (ref 135–145)
Total Bilirubin: 0.7 mg/dL (ref 0.3–1.2)
Total Protein: 7.6 g/dL (ref 6.5–8.1)

## 2021-01-21 LAB — TROPONIN I (HIGH SENSITIVITY): Troponin I (High Sensitivity): <2 ng/L (ref <18)

## 2021-01-21 NOTE — Discharge Instructions (Signed)
Your workup is reassuring. Please follow up with PCP for re-evaluation as needed.

## 2021-01-21 NOTE — ED Provider Notes (Signed)
[facility] EMERGENCY DEPARTMENT Provider Note   CSN: [id number] Arrival date & time: 01/21/21  1022     History  Chief Complaint  Patient presents with   Chest Pain    Cameron Caldwell is a 26 y.o. male.   Chest Pain Associated symptoms: no abdominal pain, no back pain, no cough, no fever, no palpitations, no shortness of breath and no vomiting    Patient with history of asthma and anxiety presents due to left-sided chest pain.  It started acutely 30 minutes ago, it is well localized directly under his left pectoral muscle.  Does not radiate elsewhere, the pain is intermittent and worse with inspiration.  Has had pain like this previously, does not have a definitive cause.  Does not smoke cigarettes, no history of previous MI or PE.  Home Medications Prior to Admission medications   Medication Sig Start Date End Date Taking? Authorizing Provider  albuterol (PROVENTIL) (2.5 MG/3ML) 0.083% nebulizer solution Take 3 mLs (2.5 mg total) by nebulization every 6 (six) hours as needed for wheezing or shortness of breath. 05/23/20   Orpah Greek, MD  albuterol (VENTOLIN HFA) 108 (90 Base) MCG/ACT inhaler Inhale 2 puffs into the lungs every 4 (four) hours as needed for wheezing or shortness of breath. 05/23/20   Orpah Greek, MD  fluticasone (FLONASE) 50 MCG/ACT nasal spray Place 1 spray into both nostrils daily. 08/13/19   Petrucelli, Samantha R, PA-C  hydrOXYzine (ATARAX/VISTARIL) 25 MG tablet Take 1 tablet (25 mg total) by mouth every 8 (eight) hours as needed for anxiety. 06/10/20   Corena Herter, PA-C  naproxen (NAPROSYN) 500 MG tablet TK 1 T PO BID 03/27/18   [provider]  oxyCODONE-acetaminophen (PERCOCET/ROXICET) 5-325 MG tablet TK 1 T PO D 03/27/18   [provider]  predniSONE (DELTASONE) 20 MG tablet Take 2 tablets (40 mg total) by mouth daily with breakfast. 05/23/20   Pollina, Gwenyth Allegra, MD  QVAR REDIHALER 40 MCG/ACT inhaler INHALE 1  PUFF PO BID 10/28/17   [provider]      Allergies    Cat hair extract and Amoxicillin    Review of Systems   Review of Systems  Constitutional:  Negative for chills and fever.  HENT:  Negative for ear pain and sore throat.   Eyes:  Negative for pain and visual disturbance.  Respiratory:  Negative for cough and shortness of breath.   Cardiovascular:  Positive for chest pain. Negative for palpitations.  Gastrointestinal:  Negative for abdominal pain and vomiting.  Genitourinary:  Negative for dysuria and hematuria.  Musculoskeletal:  Negative for arthralgias and back pain.  Skin:  Negative for color change and rash.  Neurological:  Negative for seizures and syncope.  Psychiatric/Behavioral:  The patient is nervous/anxious.   All other systems reviewed and are negative.  Physical Exam Updated Vital Signs BP 127/85 (BP Location: Left Arm)    Pulse 80    Temp 98.3 F (36.8 C) (Oral)    Resp 20    Ht 4\' 7"  (1.397 m)    Wt 59 kg    SpO2 100%    BMI 30.21 kg/m  Physical Exam Vitals and nursing note reviewed. Exam conducted with a chaperone present.  Constitutional:      Appearance: Normal appearance.  HENT:     Head: Normocephalic and atraumatic.  Eyes:     General: No scleral icterus.       Right eye: No discharge.  Left eye: No discharge.     Extraocular Movements: Extraocular movements intact.     Pupils: Pupils are equal, round, and reactive to light.  Cardiovascular:     Rate and Rhythm: Normal rate and regular rhythm.     Pulses: Normal pulses.     Heart sounds: Normal heart sounds. No murmur heard.   No friction rub. No gallop.  Pulmonary:     Effort: Pulmonary effort is normal. No respiratory distress.     Breath sounds: Normal breath sounds.  Abdominal:     General: Abdomen is flat. Bowel sounds are normal. There is no distension.     Palpations: Abdomen is soft.     Tenderness: There is no abdominal tenderness.  Skin:    General: Skin is warm  and dry.     Coloration: Skin is not jaundiced.  Neurological:     Mental Status: He is alert. Mental status is at baseline.     Coordination: Coordination normal.    ED Results / Procedures / Treatments   Labs (all labs ordered are listed, but only abnormal results are displayed) Labs Reviewed  CBC WITH DIFFERENTIAL/PLATELET  COMPREHENSIVE METABOLIC PANEL  TROPONIN I (HIGH SENSITIVITY)    EKG EKG Interpretation  Date/Time:  Wednesday January 21 2021 10:28:40 EST Ventricular Rate:  82 PR Interval:  160 QRS Duration: 92 QT Interval:  330 QTC Calculation: 385 R Axis:   93 Text Interpretation: Normal sinus rhythm Rightward axis Borderline ECG NO SIGNIFICANT CHANGE SINCE LAST TRACING YESTERDAY Confirmed by Gareth Morgan [phone]) on 01/21/2021 11:18:16 AM  Radiology No results found.  Procedures Procedures    Medications Ordered in ED Medications - No data to display  ED Course/ Medical Decision Making/ A&P                           Medical Decision Making This is a 26 year old male presenting due to left-sided chest pain.  Is well localized, no cardiac history.  He has very low risk factors, not hypertensive, no hyperlipidemia, no history of diabetes, no family member with her tachycardia for the age of 72.  Patient is PERC negative, not hypoxic or tachycardic or tachypneic.  I do not think this is a PE.  Troponin negative, EKG is without any acute changes.  Radiograph negative for any widened mediastinum concerning for dissection, additionally his age makes this less likely.  There is no evidence of pneumonia or pneumothorax.  Overall, do not suspect his chest pain is cardiac or emergent in nature.  Discussed with the patient, he is agreeable to following up with his PCP outpatient.  Patient discharged in stable condition.  Amount and/or Complexity of Data Reviewed Labs:  Decision-making details documented in ED Course. Radiology:  Decision-making details documented in ED  Course.  Risk OTC drugs. Prescription drug management.           Final Clinical Impression(s) / ED Diagnoses Final diagnoses:  None    Rx / DC Orders ED Discharge Orders     None         Sherrill Raring, PA-C 01/21/21 1633    Gareth Morgan, MD 01/21/21 2255

## 2021-01-21 NOTE — ED Triage Notes (Signed)
Chest pain x 30 minutes,  left side  increased w deep breath and movement  denies vomiting  no shortness of breath

## 2021-05-13 ENCOUNTER — Emergency Department (HOSPITAL_BASED_OUTPATIENT_CLINIC_OR_DEPARTMENT_OTHER)
Admission: EM | Admit: 2021-05-13 | Discharge: 2021-05-13 | Disposition: A | Discharge status: Home / Self Care | Payer: BC Managed Care – PPO | Attending: Emergency Medicine | Admitting: Emergency Medicine

## 2021-05-13 ENCOUNTER — Encounter (HOSPITAL_BASED_OUTPATIENT_CLINIC_OR_DEPARTMENT_OTHER): Payer: Self-pay | Admitting: Emergency Medicine

## 2021-05-13 ENCOUNTER — Emergency Department (HOSPITAL_BASED_OUTPATIENT_CLINIC_OR_DEPARTMENT_OTHER): Payer: BC Managed Care – PPO

## 2021-05-13 DIAGNOSIS — R0602 Shortness of breath: Secondary | ICD-10-CM | POA: Insufficient documentation

## 2021-05-13 DIAGNOSIS — R1013 Epigastric pain: Secondary | ICD-10-CM | POA: Insufficient documentation

## 2021-05-13 DIAGNOSIS — R079 Chest pain, unspecified: Secondary | ICD-10-CM | POA: Insufficient documentation

## 2021-05-13 DIAGNOSIS — R072 Precordial pain: Secondary | ICD-10-CM

## 2021-05-13 LAB — TROPONIN I (HIGH SENSITIVITY): Troponin I (High Sensitivity): 2 ng/L (ref <18)

## 2021-05-13 MED ORDER — OMEPRAZOLE 20 MG PO CPDR
20.0000 mg | DELAYED_RELEASE_CAPSULE | Freq: Every day | ORAL | 0 refills | Status: AC
Start: 2021-05-13 — End: ?

## 2021-05-13 MED ORDER — ALBUTEROL SULFATE HFA 108 (90 BASE) MCG/ACT IN AERS: 1.0000 | INHALATION_SPRAY | Freq: Four times a day (QID) | RESPIRATORY_TRACT | 0 refills | Status: AC | PRN

## 2021-05-13 MED ORDER — IBUPROFEN 800 MG PO TABS
800.0000 mg | ORAL_TABLET | Freq: Once | ORAL | Status: AC
  Administered 2021-05-13: 800 mg via ORAL
  Filled 2021-05-13: qty 1

## 2021-05-13 MED ORDER — ALUM & MAG HYDROXIDE-SIMETH 200-200-20 MG/5ML PO SUSP
30.0000 mL | Freq: Once | ORAL | Status: AC
  Administered 2021-05-13: 30 mL via ORAL
  Filled 2021-05-13: qty 30

## 2021-05-13 MED ORDER — OMEPRAZOLE 20 MG PO CPDR: 20.0000 mg | DELAYED_RELEASE_CAPSULE | Freq: Every day | ORAL | 0 refills | Status: AC

## 2021-05-13 NOTE — ED Triage Notes (Signed)
Pt c/o left sided cp and shob that began earlier today. Denies radiation. Hx of asthma. Took home inhaler with some relief. NAD.  ?

## 2021-05-13 NOTE — ED Provider Notes (Signed)
?[facility] EMERGENCY DEPARTMENT ?Provider Note ? ? ?CSN: [id number] ?Arrival date & time: 05/13/21  [id number] ? ?  ? ?History ? ?Chief Complaint  ?Patient presents with  ? Chest Pain  ? Shortness of Breath  ? ? ?Cameron Caldwell is a 26 y.o. male. ? ?The history is provided by the patient.  ?Chest Pain ?Pain location:  Epigastric ?Pain quality: aching   ?Pain radiates to:  Does not radiate ?Pain severity:  Moderate ?Onset quality:  Gradual ?Duration: 8. ?Timing:  Constant ?Progression:  Waxing and waning ?Chronicity:  Recurrent ?Context: eating and at rest   ?Context: not breathing   ?Relieved by:  Nothing ?Worsened by:  Nothing ?Ineffective treatments:  None tried ?Associated symptoms: no abdominal pain, no fever, no palpitations and no weakness   ?Risk factors: male sex   ? ?  ? ?Home Medications ?Prior to Admission medications   ?Medication Sig Start Date End Date Taking? Authorizing Provider  ?albuterol (PROVENTIL) (2.5 MG/3ML) 0.083% nebulizer solution Take 3 mLs (2.5 mg total) by nebulization every 6 (six) hours as needed for wheezing or shortness of breath. 05/23/20   Gilda CreasePollina, Christopher J, MD  ?albuterol (VENTOLIN HFA) 108 (90 Base) MCG/ACT inhaler Inhale 2 puffs into the lungs every 4 (four) hours as needed for wheezing or shortness of breath. 05/23/20   Gilda CreasePollina, Christopher J, MD  ?fluticasone (FLONASE) 50 MCG/ACT nasal spray Place 1 spray into both nostrils daily. 08/13/19   Petrucelli, Samantha R, PA-C  ?hydrOXYzine (ATARAX/VISTARIL) 25 MG tablet Take 1 tablet (25 mg total) by mouth every 8 (eight) hours as needed for anxiety. 06/10/20   Lorelee NewGreen, Garrett L, PA-C  ?naproxen (NAPROSYN) 500 MG tablet TK 1 T PO BID 03/27/18   [provider]  ?oxyCODONE-acetaminophen (PERCOCET/ROXICET) 5-325 MG tablet TK 1 T PO D 03/27/18   [provider]  ?predniSONE (DELTASONE) 20 MG tablet Take 2 tablets (40 mg total) by mouth daily with breakfast. 05/23/20   Gilda CreasePollina, Christopher J, MD  ?Shon HaleQVAR REDIHALER 40  MCG/ACT inhaler INHALE 1 PUFF PO BID 10/28/17   [provider]  ?   ? ?Allergies    ?Cat hair extract and Amoxicillin   ? ?Review of Systems   ?Review of Systems  ?Constitutional:  Negative for fever.  ?HENT:  Negative for facial swelling.   ?Eyes:  Negative for redness.  ?Cardiovascular:  Positive for chest pain. Negative for palpitations and leg swelling.  ?Gastrointestinal:  Negative for abdominal pain.  ?Genitourinary:  Negative for difficulty urinating.  ?Musculoskeletal:  Negative for neck pain and neck stiffness.  ?Skin:  Negative for rash.  ?Neurological:  Negative for facial asymmetry and weakness.  ?Psychiatric/Behavioral:  Negative for agitation.   ?All other systems reviewed and are negative. ? ?Physical Exam ?Updated Vital Signs ?BP 134/85 (BP Location: Left Arm)   Pulse 82   Temp 98.5 ?F (36.9 ?C) (Oral)   Resp 16   Wt 63.5 kg   SpO2 97%   BMI 32.54 kg/m?  ?Physical Exam ?Vitals and nursing note reviewed.  ?Constitutional:   ?   General: He is not in acute distress. ?   Appearance: Normal appearance.  ?HENT:  ?   Head: Normocephalic and atraumatic.  ?   Nose: Nose normal.  ?Eyes:  ?   Conjunctiva/sclera: Conjunctivae normal.  ?   Pupils: Pupils are equal, round, and reactive to light.  ?Cardiovascular:  ?   Rate and Rhythm: Normal rate and regular rhythm.  ?   Pulses: Normal  pulses.  ?   Heart sounds: Normal heart sounds.  ?Pulmonary:  ?   Effort: Pulmonary effort is normal.  ?   Breath sounds: Normal breath sounds.  ?Abdominal:  ?   General: Abdomen is flat. Bowel sounds are increased.  ?   Palpations: Abdomen is soft.  ?   Tenderness: There is no abdominal tenderness. There is no guarding. Negative signs include Murphy's sign and McBurney's sign.  ?Musculoskeletal:     ?   General: Normal range of motion.  ?   Cervical back: Normal range of motion and neck supple.  ?   Right lower leg: No edema.  ?   Left lower leg: No edema.  ?Skin: ?   General: Skin is warm and dry.  ?   Capillary  Refill: Capillary refill takes less than 2 seconds.  ?Neurological:  ?   General: No focal deficit present.  ?   Mental Status: He is alert and oriented to person, place, and time.  ?   Deep Tendon Reflexes: Reflexes normal.  ?Psychiatric:     ?   Mood and Affect: Mood normal.     ?   Behavior: Behavior normal.  ? ? ?ED Results / Procedures / Treatments   ?Labs ?(all labs ordered are listed, but only abnormal results are displayed) ?Labs Reviewed  ?TROPONIN I (HIGH SENSITIVITY)  ? ? ?EKG ?EKG Interpretation ? ?Date/Time:  Wednesday Cameron Caldwell 26 2023 00:53:04 EDT ?Ventricular Rate:  74 ?PR Interval:  154 ?QRS Duration: 88 ?QT Interval:  334 ?QTC Calculation: 370 ?R Axis:   91 ?Text Interpretation: Normal sinus rhythm Confirmed by Nicanor Alcon, Bowen Kia ([id number]) on 05/13/2021 1:17:31 AM ? ?Radiology ?DG Chest Portable 1 View ? ?Result Date: 05/13/2021 ?CLINICAL DATA:  Chest pain, shortness of breath EXAM: PORTABLE CHEST 1 VIEW COMPARISON:  01/21/2021 FINDINGS: The heart size and mediastinal contours are within normal limits. Both lungs are clear. The visualized skeletal structures are unremarkable. IMPRESSION: Normal study. Electronically Signed   By: Charlett Nose M.D.   On: 05/13/2021 01:10   ? ?Procedures ?Procedures  ? ? ?Medications Ordered in ED ?Medications  ?alum & mag hydroxide-simeth (MAALOX/MYLANTA) 200-200-20 MG/5ML suspension 30 mL (has no administration in time range)  ?ibuprofen (ADVIL) tablet 800 mg (has no administration in time range)  ? ? ?ED Course/ Medical Decision Making/ A&P ?  ?                        ?Medical Decision Making ?Epigastric pain at rest post eating chicken wings.  No leg pain no surgery.   ? ?Amount and/or Complexity of Data Reviewed ?Independent Historian: spouse ?   Details: see above ?External Data Reviewed: labs, ECG and notes. ?   Details: previous ED notes labs and EKGs reviewed ?Labs: ordered. ?   Details: all labs reviewed by me: troponin is negative at 2 ?Radiology: ordered and  independent interpretation performed. ?   Details: normal by my reading ?ECG/medicine tests: ordered and independent interpretation performed. Decision-making details documented in ED Course. ? ?Risk ?OTC drugs. ?Prescription drug management. ?Risk Details: DDX: cardiac pain, MSK pain, and GERD.  Patient is well appearing with stable vital signs and exam.  Hsitroy is highly atypical for cardiac etiology.  No exertional symptoms.  HEART score is 1 low risk for MACE.  PERC negative wells ) highly doubt PE in this low risk patient.  Symptoms are consistent with GERD.  I will start a PPI and have  patient follow up with PMD>   ? ? ? ?Final Clinical Impression(s) / ED Diagnoses ?Final diagnoses:  ?None  ? ?Return for intractable cough, coughing up blood, fevers > 100.4 unrelieved by medication, shortness of breath, intractable vomiting, chest pain, shortness of breath, weakness, numbness, changes in speech, facial asymmetry, abdominal pain, passing out, Inability to tolerate liquids or food, cough, altered mental status or any concerns. No signs of systemic illness or infection. The patient is nontoxic-appearing on exam and vital signs are within normal limits.  ?I have reviewed the triage vital signs and the nursing notes. Pertinent labs & imaging results that were available during my care of the patient were reviewed by me and considered in my medical decision making (see chart for details). After history, exam, and medical workup I feel the patient has been appropriately medically screened and is safe for discharge home. Pertinent diagnoses were discussed with the patient. Patient was given return precautions. ?  ?  ?Rx / DC Orders ?ED Discharge Orders   ? ? None  ? ?  ? ? ?  ?Jillian Warth, MD ?05/13/21 0140 ? ?

## 2021-05-13 NOTE — ED Notes (Signed)
Pt NAD, a/ox4. Pt verbalizes understanding of all DC and f/u instructions. All questions answered. Pt walks with steady gait to lobby at DC.  ? ?

## 2021-07-09 ENCOUNTER — Emergency Department (HOSPITAL_BASED_OUTPATIENT_CLINIC_OR_DEPARTMENT_OTHER)
Admission: EM | Admit: 2021-07-09 | Discharge: 2021-07-09 | Disposition: A | Discharge status: Home / Self Care | Payer: BC Managed Care – PPO | Attending: Emergency Medicine | Admitting: Emergency Medicine

## 2021-07-09 ENCOUNTER — Other Ambulatory Visit: Payer: Self-pay

## 2021-07-09 ENCOUNTER — Encounter (HOSPITAL_BASED_OUTPATIENT_CLINIC_OR_DEPARTMENT_OTHER): Payer: Self-pay | Admitting: Emergency Medicine

## 2021-07-09 ENCOUNTER — Emergency Department (HOSPITAL_BASED_OUTPATIENT_CLINIC_OR_DEPARTMENT_OTHER): Payer: BC Managed Care – PPO

## 2021-07-09 DIAGNOSIS — Z7951 Long term (current) use of inhaled steroids: Secondary | ICD-10-CM | POA: Insufficient documentation

## 2021-07-09 DIAGNOSIS — J45909 Unspecified asthma, uncomplicated: Secondary | ICD-10-CM | POA: Diagnosis not present

## 2021-07-09 DIAGNOSIS — R079 Chest pain, unspecified: Secondary | ICD-10-CM | POA: Insufficient documentation

## 2021-07-09 LAB — BASIC METABOLIC PANEL
Anion gap: 8 (ref 5–15)
BUN: 13 mg/dL (ref 6–20)
CO2: 25 mmol/L (ref 22–32)
Calcium: 9.4 mg/dL (ref 8.9–10.3)
Chloride: 106 mmol/L (ref 98–111)
Creatinine, Ser: 0.76 mg/dL (ref 0.61–1.24)
GFR, Estimated: >60 mL/min (ref >60)
Glucose, Bld: 96 mg/dL (ref 70–99)
Potassium: 3.9 mmol/L (ref 3.5–5.1)
Sodium: 139 mmol/L (ref 135–145)

## 2021-07-09 LAB — CBC
HCT: 40.9 % (ref 39.0–52.0)
Hemoglobin: 14.3 g/dL (ref 13.0–17.0)
MCH: 29.5 pg (ref 26.0–34.0)
MCHC: 35 g/dL (ref 30.0–36.0)
MCV: 84.5 fL (ref 80.0–100.0)
Platelets: 201 10*3/uL (ref 150–400)
RBC: 4.84 MIL/uL (ref 4.22–5.81)
RDW: 12 % (ref 11.5–15.5)
WBC: 4.6 10*3/uL (ref 4.0–10.5)
nRBC: 0 % (ref 0.0–0.2)

## 2021-07-09 LAB — TROPONIN I (HIGH SENSITIVITY): Troponin I (High Sensitivity): 2 ng/L (ref <18)

## 2021-07-09 NOTE — Discharge Instructions (Addendum)
Based on your work-up today I think that is possible you had some mild esophagus irritation from your nausea and vomiting episode a few days ago, versus chest pain related to anxiety.  I saw no evidence of chest pain related to your heart or lungs today.

## 2021-07-09 NOTE — ED Triage Notes (Signed)
Pt reports left lower cp and "shaking all over" that started 30 minutes pta. Radiates down into LUQ of abdomen.

## 2021-11-22 ENCOUNTER — Encounter (HOSPITAL_BASED_OUTPATIENT_CLINIC_OR_DEPARTMENT_OTHER): Payer: Self-pay | Admitting: Urology

## 2021-11-22 ENCOUNTER — Other Ambulatory Visit: Payer: Self-pay

## 2021-11-22 ENCOUNTER — Emergency Department (HOSPITAL_BASED_OUTPATIENT_CLINIC_OR_DEPARTMENT_OTHER)
Admission: EM | Admit: 2021-11-22 | Discharge: 2021-11-22 | Disposition: A | Discharge status: Home / Self Care | Payer: Self-pay | Attending: Emergency Medicine | Admitting: Emergency Medicine

## 2021-11-22 ENCOUNTER — Emergency Department (HOSPITAL_BASED_OUTPATIENT_CLINIC_OR_DEPARTMENT_OTHER): Payer: Self-pay

## 2021-11-22 DIAGNOSIS — R079 Chest pain, unspecified: Secondary | ICD-10-CM | POA: Insufficient documentation

## 2021-11-22 LAB — CBC
HCT: 41.6 % (ref 39.0–52.0)
Hemoglobin: 14.4 g/dL (ref 13.0–17.0)
MCH: 28.6 pg (ref 26.0–34.0)
MCHC: 34.6 g/dL (ref 30.0–36.0)
MCV: 82.7 fL (ref 80.0–100.0)
Platelets: 189 10*3/uL (ref 150–400)
RBC: 5.03 MIL/uL (ref 4.22–5.81)
RDW: 11.8 % (ref 11.5–15.5)
WBC: 4.1 10*3/uL (ref 4.0–10.5)
nRBC: 0 % (ref 0.0–0.2)

## 2021-11-22 LAB — BASIC METABOLIC PANEL
Anion gap: 7 (ref 5–15)
BUN: 10 mg/dL (ref 6–20)
CO2: 24 mmol/L (ref 22–32)
Calcium: 9 mg/dL (ref 8.9–10.3)
Chloride: 105 mmol/L (ref 98–111)
Creatinine, Ser: 0.74 mg/dL (ref 0.61–1.24)
GFR, Estimated: >60 mL/min (ref >60)
Glucose, Bld: 84 mg/dL (ref 70–99)
Potassium: 3.6 mmol/L (ref 3.5–5.1)
Sodium: 136 mmol/L (ref 135–145)

## 2021-11-22 LAB — TROPONIN I (HIGH SENSITIVITY)
Troponin I (High Sensitivity): <2 ng/L (ref <18)
Troponin I (High Sensitivity): <2 ng/L (ref <18)

## 2021-11-22 MED ORDER — FAMOTIDINE 20 MG PO TABS
20.0000 mg | ORAL_TABLET | Freq: Once | ORAL | Status: AC
  Administered 2021-11-22: 20 mg via ORAL
  Filled 2021-11-22: qty 1

## 2021-11-22 MED ORDER — ALUM & MAG HYDROXIDE-SIMETH 200-200-20 MG/5ML PO SUSP
30.0000 mL | Freq: Once | ORAL | Status: AC
  Administered 2021-11-22: 30 mL via ORAL
  Filled 2021-11-22: qty 30

## 2021-11-22 MED ORDER — ACETAMINOPHEN 500 MG PO TABS
1000.0000 mg | ORAL_TABLET | Freq: Once | ORAL | Status: AC
  Administered 2021-11-22: 1000 mg via ORAL
  Filled 2021-11-22: qty 2

## 2021-11-22 NOTE — ED Notes (Signed)
ED Provider at bedside. 

## 2021-11-22 NOTE — ED Notes (Signed)
Patient transported to X-ray 

## 2021-11-22 NOTE — ED Provider Notes (Signed)
[facility] EMERGENCY DEPARTMENT Provider Note   CSN: [id number] Arrival date & time: 11/22/21  1546     History Chief Complaint  Patient presents with   Chest Pain    HPI Cameron Caldwell is a 26 y.o. male presenting for chest pain.  Patient states that he has had multiple days of left anterior chest wall pain.  It radiates into his left upper quadrant.  It has been intermittent in nature.  He denies fevers or chills, nausea vomiting, syncope shortness of breath.  He states he has a diagnosis of indigestion and feels like progression of those symptoms.  He denies any positional change in his symptoms.  No medications taken prior to arrival.  He does have a history of anxiety and has been compliant on all of his psychiatric medications.   Patient's recorded medical, surgical, social, medication list and allergies were reviewed in the Snapshot window as part of the initial history.   Review of Systems   Review of Systems  Constitutional:  Negative for chills and fever.  HENT:  Negative for ear pain and sore throat.   Eyes:  Negative for pain and visual disturbance.  Respiratory:  Negative for cough and shortness of breath.   Cardiovascular:  Positive for chest pain. Negative for palpitations.  Gastrointestinal:  Negative for abdominal pain and vomiting.  Genitourinary:  Negative for dysuria and hematuria.  Musculoskeletal:  Negative for arthralgias and back pain.  Skin:  Negative for color change and rash.  Neurological:  Negative for seizures and syncope.  All other systems reviewed and are negative.   Physical Exam Updated Vital Signs BP 127/84   Pulse 80   Temp 98.2 F (36.8 C) (Oral)   Resp 19   Ht 4\' 7"  (1.397 m)   Wt 63.5 kg   SpO2 97%   BMI 32.54 kg/m  Physical Exam Vitals and nursing note reviewed.  Constitutional:      General: He is not in acute distress.    Appearance: He is well-developed.  HENT:     Head: Normocephalic and atraumatic.  Eyes:      Conjunctiva/sclera: Conjunctivae normal.  Cardiovascular:     Rate and Rhythm: Normal rate and regular rhythm.     Heart sounds: No murmur heard. Pulmonary:     Effort: Pulmonary effort is normal. No respiratory distress.     Breath sounds: Normal breath sounds.  Abdominal:     Palpations: Abdomen is soft.     Tenderness: There is no abdominal tenderness.  Musculoskeletal:        General: No swelling.     Cervical back: Neck supple.  Skin:    General: Skin is warm and dry.     Capillary Refill: Capillary refill takes less than 2 seconds.  Neurological:     Mental Status: He is alert.  Psychiatric:        Mood and Affect: Mood normal.      ED Course/ Medical Decision Making/ A&P    Procedures Procedures   Medications Ordered in ED Medications  acetaminophen (TYLENOL) tablet 1,000 mg (1,000 mg Oral Given 11/22/21 1619)  famotidine (PEPCID) tablet 20 mg (20 mg Oral Given 11/22/21 1619)  alum & mag hydroxide-simeth (MAALOX/MYLANTA) 200-200-20 MG/5ML suspension 30 mL (30 mLs Oral Given 11/22/21 1618)   Medical Decision Making: 13/5/23 Cameron Caldwell is a 26 y.o. male who presented to the ED today with chest pain, detailed above.  Based on patient's comorbidities, patient has a heart  score of 1.    Handoff received from EMS.  Patient's presentation is complicated by their history of psychiatric medical problems on outpatient psychiatric medications.  Patient placed on continuous vitals and telemetry monitoring while in ED which was reviewed periodically.  Complete initial physical exam performed, notably the patient was HDS in NAD.   Reviewed and confirmed nursing documentation for past medical history, family history, social history.    Initial Assessment: With the patient's presentation of left-sided chest pain, most likely diagnosis is musculoskeletal chest pain versus GERD, although ACS remains on the differential. Other diagnoses were considered including (but not limited to)  pulmonary embolism, community-acquired pneumonia, aortic dissection, pneumothorax, underlying bony abnormality, anemia. These are considered less likely due to history of present illness and physical exam findings.    In particular, concerning pulmonary embolism: Patient is PERC negative and the they deny malignancy, recent surgery, history of DVT, or calf tenderness leading to a low risk Wells score. Aortic Dissection also reconsidered but seems less likely based on the location, quality, onset, and severity of symptoms in this case. Patient has a lack of serious comorbidities for this condition including a lack of HTN or Smoking. Patient also has a lack of underlying history of AD or TAA.  This is most consistent with an acute life/limb threatening illness complicated by underlying chronic conditions.   Initial Plan: Evaluate for ACS with delta troponin and EKG evaluated as below  Evaluate for dissection, bony abnormality, or pneumonia with chest x-ray and screening laboratory evaluation including CBC, BMP  Further evaluation for pulmonary embolism not indicated at this time based on patient's PERC and Wells score.  Further evaluation for Thoracic Aortic Dissection not indicated at this time based on patient's clinical history and PE findings.   Initial Study Results: EKG was reviewed independently. Rate, rhythm, axis, intervals all examined and without medically relevant abnormality. ST segments without concerns for elevations.    Laboratory  Delta troponin demonstratedNAA   CBC and BMP without obvious metabolic or inflammatory abnormalities requiring further evaluation   Radiology  DG Chest 2 View  Result Date: 11/22/2021 CLINICAL DATA:  Left-sided chest pain, shortness of breath, and dizziness beginning 1 hour ago. EXAM: CHEST - 2 VIEW COMPARISON:  09/07/2021 FINDINGS: The heart size and mediastinal contours are within normal limits. Both lungs are clear. The visualized skeletal structures  are unremarkable. IMPRESSION: Normal exam. Electronically Signed   By: Marlaine Hind M.D.   On: 11/22/2021 16:31    Final Assessment and Plan: On repeat evaluation after 4 hours of observation in the emergency department, patient's symptoms have completely resolved.  No acute pathology identified on troponin and radiographic evaluation today.  Given his resolution with antacid medications, likely gastroesophageal versus musculoskeletal etiology though it still remains nonspecific.  Recommend patient follow-up closely with his primary care provider within 48 hours given the diagnostic uncertainty and return with any changes in his symptoms. Disposition:  I have considered need for hospitalization, however, considering all of the above, I believe this patient is stable for discharge at this time.  Patient/family educated about specific return precautions for given chief complaint and symptoms.  Patient/family educated about follow-up with PCP .     Patient/family expressed understanding of return precautions and need for follow-up. Patient spoken to regarding all imaging and laboratory results and appropriate follow up for these results. All education provided in verbal form with additional information in written form. Time was allowed for answering of patient questions. Patient discharged.  Emergency Department Medication Summary:   Medications  acetaminophen (TYLENOL) tablet 1,000 mg (1,000 mg Oral Given 11/22/21 1619)  famotidine (PEPCID) tablet 20 mg (20 mg Oral Given 11/22/21 1619)  alum & mag hydroxide-simeth (MAALOX/MYLANTA) 200-200-20 MG/5ML suspension 30 mL (30 mLs Oral Given 11/22/21 1618)                  Clinical Impression:  1. Chest pain, unspecified type      Discharge   Final Clinical Impression(s) / ED Diagnoses Final diagnoses:  Chest pain, unspecified type    Rx / DC Orders ED Discharge Orders     None         Glyn Ade, MD 11/22/21 1952

## 2021-11-22 NOTE — ED Triage Notes (Addendum)
Pt states left sided chest pain and dizziness that started approx 1 hr PTA , SOB associated with pain  Denies LOC  States pain has gotten better  Pt states nausea but denies vomiting   Denies Cardiac History, Takes oxycodone for chronic back pain

## 2021-12-01 ENCOUNTER — Emergency Department (HOSPITAL_BASED_OUTPATIENT_CLINIC_OR_DEPARTMENT_OTHER)
Admission: EM | Admit: 2021-12-01 | Discharge: 2021-12-02 | Disposition: A | Discharge status: Home / Self Care | Payer: Self-pay | Attending: Emergency Medicine | Admitting: Emergency Medicine

## 2021-12-01 ENCOUNTER — Encounter (HOSPITAL_BASED_OUTPATIENT_CLINIC_OR_DEPARTMENT_OTHER): Payer: Self-pay | Admitting: Emergency Medicine

## 2021-12-01 ENCOUNTER — Other Ambulatory Visit: Payer: Self-pay

## 2021-12-01 DIAGNOSIS — R002 Palpitations: Secondary | ICD-10-CM | POA: Insufficient documentation

## 2021-12-01 DIAGNOSIS — R110 Nausea: Secondary | ICD-10-CM | POA: Insufficient documentation

## 2021-12-01 DIAGNOSIS — R079 Chest pain, unspecified: Secondary | ICD-10-CM | POA: Insufficient documentation

## 2021-12-01 DIAGNOSIS — R42 Dizziness and giddiness: Secondary | ICD-10-CM | POA: Insufficient documentation

## 2021-12-01 DIAGNOSIS — R300 Dysuria: Secondary | ICD-10-CM | POA: Insufficient documentation

## 2021-12-01 LAB — BASIC METABOLIC PANEL
Anion gap: 6 (ref 5–15)
BUN: 19 mg/dL (ref 6–20)
CO2: 26 mmol/L (ref 22–32)
Calcium: 9.2 mg/dL (ref 8.9–10.3)
Chloride: 104 mmol/L (ref 98–111)
Creatinine, Ser: 0.83 mg/dL (ref 0.61–1.24)
GFR, Estimated: >60 mL/min (ref >60)
Glucose, Bld: 88 mg/dL (ref 70–99)
Potassium: 4 mmol/L (ref 3.5–5.1)
Sodium: 136 mmol/L (ref 135–145)

## 2021-12-01 LAB — CBC
HCT: 42.8 % (ref 39.0–52.0)
Hemoglobin: 14.7 g/dL (ref 13.0–17.0)
MCH: 28.9 pg (ref 26.0–34.0)
MCHC: 34.3 g/dL (ref 30.0–36.0)
MCV: 84.3 fL (ref 80.0–100.0)
Platelets: 206 10*3/uL (ref 150–400)
RBC: 5.08 MIL/uL (ref 4.22–5.81)
RDW: 12 % (ref 11.5–15.5)
WBC: 6.4 10*3/uL (ref 4.0–10.5)
nRBC: 0 % (ref 0.0–0.2)

## 2021-12-01 NOTE — ED Triage Notes (Signed)
Pt reports dizziness, nausea, and heart palpitation x 2 hours.

## 2021-12-01 NOTE — Discharge Instructions (Signed)
Schedule point follow-up with your provider.  Today's evaluation without any acute findings Labs were normal.  Cardiac monitoring without any arrhythmias.

## 2021-12-01 NOTE — ED Provider Notes (Addendum)
[place] EMERGENCY DEPARTMENT Provider Note   CSN: [id number] Arrival date & time: 12/01/21  1806     History  Chief Complaint  Patient presents with   Dizziness   Nausea   Palpitations    Cameron Caldwell is a 26 y.o. male.  Patient seen frequently for chest pain.  Most recently seen November 5.  Had normal labs at that time normal troponins negative chest x-ray.  Patient states this time he had some heart palpitations on and off for a total of about 2 hours.  Associated with some dizziness and nausea.  But denies any chest pain this time.  Also stating that he has dysuria.  Does have a primary care doctor.  In addition he denies any shortness of breath.  Cardiac monitoring here and EKG without any acute arrhythmias prior to me seeing him.  Oxygen saturations is up in the 100% range.  Heart rates been below 100.  Past medical history is significant for asthma [place] plasia back pain.  Patient does not use tobacco products.  Past surgical history noncontributory.       Home Medications Prior to Admission medications   Medication Sig Start Date End Date Taking? Authorizing Provider  albuterol (PROVENTIL) (2.5 MG/3ML) 0.083% nebulizer solution Take 3 mLs (2.5 mg total) by nebulization every 6 (six) hours as needed for wheezing or shortness of breath. 05/23/20   Orpah Greek, MD  albuterol (VENTOLIN HFA) 108 (90 Base) MCG/ACT inhaler Inhale 2 puffs into the lungs every 4 (four) hours as needed for wheezing or shortness of breath. 05/23/20   Orpah Greek, MD  albuterol (VENTOLIN HFA) 108 (90 Base) MCG/ACT inhaler Inhale 1-2 puffs into the lungs every 6 (six) hours as needed for wheezing or shortness of breath. 05/13/21   Palumbo, April, MD  fluticasone (FLONASE) 50 MCG/ACT nasal spray Place 1 spray into both nostrils daily. 08/13/19   Petrucelli, Samantha R, PA-C  hydrOXYzine (ATARAX/VISTARIL) 25 MG tablet Take 1 tablet (25 mg total) by mouth every 8 (eight)  hours as needed for anxiety. 06/10/20   Corena Herter, PA-C  naproxen (NAPROSYN) 500 MG tablet TK 1 T PO BID 03/27/18   [provider]  omeprazole (PRILOSEC) 20 MG capsule Take 1 capsule (20 mg total) by mouth daily. 05/13/21   Palumbo, April, MD  omeprazole (PRILOSEC) 20 MG capsule Take 1 capsule (20 mg total) by mouth daily. 05/13/21   Palumbo, April, MD  oxyCODONE-acetaminophen (PERCOCET/ROXICET) 5-325 MG tablet TK 1 T PO D 03/27/18   [provider]  predniSONE (DELTASONE) 20 MG tablet Take 2 tablets (40 mg total) by mouth daily with breakfast. 05/23/20   Pollina, Gwenyth Allegra, MD  QVAR REDIHALER 40 MCG/ACT inhaler INHALE 1 PUFF PO BID 10/28/17   [provider]      Allergies    Cat hair extract and Amoxicillin    Review of Systems   Review of Systems  Constitutional:  Negative for chills and fever.  HENT:  Negative for rhinorrhea and sore throat.   Eyes:  Negative for visual disturbance.  Respiratory:  Negative for cough and shortness of breath.   Cardiovascular:  Positive for palpitations. Negative for chest pain and leg swelling.  Gastrointestinal:  Positive for nausea. Negative for abdominal pain, diarrhea and vomiting.  Genitourinary:  Negative for dysuria.  Musculoskeletal:  Negative for back pain and neck pain.  Skin:  Negative for rash.  Neurological:  Positive for dizziness. Negative for light-headedness and headaches.  Hematological:  Does not bruise/bleed easily.  Psychiatric/Behavioral:  Negative for confusion.     Physical Exam Updated Vital Signs BP 121/75   Pulse 90   Temp 98.5 F (36.9 C) (Oral)   Resp 20   Ht 1.448 m (4\' 9" )   Wt 62.6 kg   SpO2 99%   BMI 29.86 kg/m  Physical Exam Vitals and nursing note reviewed.  Constitutional:      General: He is not in acute distress.    Appearance: Normal appearance. He is well-developed.  HENT:     Head: Normocephalic and atraumatic.     Mouth/Throat:     Mouth: Mucous membranes are  moist.  Eyes:     Extraocular Movements: Extraocular movements intact.     Conjunctiva/sclera: Conjunctivae normal.     Pupils: Pupils are equal, round, and reactive to light.  Cardiovascular:     Rate and Rhythm: Normal rate and regular rhythm.     Heart sounds: No murmur heard. Pulmonary:     Effort: Pulmonary effort is normal. No respiratory distress.     Breath sounds: Normal breath sounds. No wheezing, rhonchi or rales.  Chest:     Chest wall: No tenderness.  Abdominal:     Palpations: Abdomen is soft.     Tenderness: There is no abdominal tenderness.  Musculoskeletal:        General: No swelling or signs of injury.     Cervical back: Normal range of motion and neck supple.  Skin:    General: Skin is warm and dry.     Capillary Refill: Capillary refill takes less than 2 seconds.  Neurological:     General: No focal deficit present.     Mental Status: He is alert and oriented to person, place, and time.  Psychiatric:        Mood and Affect: Mood normal.     ED Results / Procedures / Treatments   Labs (all labs ordered are listed, but only abnormal results are displayed) Labs Reviewed  BASIC METABOLIC PANEL  CBC  URINALYSIS, ROUTINE W REFLEX MICROSCOPIC    EKG EKG Interpretation  Date/Time:  Tuesday December 01 2021 18:34:58 EST Ventricular Rate:  86 PR Interval:  156 QRS Duration: 94 QT Interval:  332 QTC Calculation: 397 R Axis:   81 Text Interpretation: Normal sinus rhythm Normal ECG When compared with ECG of 22-Nov-2021 16:03, PREVIOUS ECG IS PRESENT No significant change since last tracing Confirmed by 24-Nov-2021 [phone]) on 12/01/2021 9:45:56 PM  Radiology No results found.  Procedures Procedures    Medications Ordered in ED Medications - No data to display  ED Course/ Medical Decision Making/ A&P                           Medical Decision Making Amount and/or Complexity of Data Reviewed Labs: ordered.   Denies being sexually active.   Talked about dysuria.  We will go ahead and check urinalysis and check CBC and basic metabolic panel.  We will continue cardiac monitoring here.  Initial EKG without any acute findings.  Do not see a need for chest x-ray or troponins at this time.  Patient's lungs are very clear.  No wheezing.  Also denies any chest pain.  Analysis still pending.  Basic metabolic panel very normal CBC very normal.  No leukocytosis hemoglobin 14.7.  Renal function normal electrolytes normal potassium 4.0 glucose 88 BUN and creatinine 19 and 0.83.  Patient  without any arrhythmias here with the cardiac monitoring has been a sinus rhythm with heart rate of 79 and oxygen sats of 99%.  Urinalysis still pending because patient was concerned about a dysuria.  But not concerned about STD because he has not been sexually active.     Final Clinical Impression(s) / ED Diagnoses Final diagnoses:  Palpitations  Dysuria    Rx / DC Orders ED Discharge Orders     None         Fredia Sorrow, MD 12/01/21 2159    Fredia Sorrow, MD 12/01/21 2342

## 2021-12-02 LAB — URINALYSIS, MICROSCOPIC (REFLEX)

## 2021-12-02 LAB — URINALYSIS, ROUTINE W REFLEX MICROSCOPIC
Bilirubin Urine: NEGATIVE
Glucose, UA: NEGATIVE mg/dL
Hgb urine dipstick: NEGATIVE
Ketones, ur: NEGATIVE mg/dL
Leukocytes,Ua: NEGATIVE
Nitrite: NEGATIVE
Protein, ur: NEGATIVE mg/dL
Specific Gravity, Urine: 1.025 (ref 1.005–1.030)
pH: 7 (ref 5.0–8.0)

## 2022-05-19 ENCOUNTER — Emergency Department (HOSPITAL_BASED_OUTPATIENT_CLINIC_OR_DEPARTMENT_OTHER): Payer: Self-pay

## 2022-05-19 ENCOUNTER — Encounter (HOSPITAL_BASED_OUTPATIENT_CLINIC_OR_DEPARTMENT_OTHER): Payer: Self-pay

## 2022-05-19 ENCOUNTER — Other Ambulatory Visit: Payer: Self-pay

## 2022-05-19 ENCOUNTER — Emergency Department (HOSPITAL_BASED_OUTPATIENT_CLINIC_OR_DEPARTMENT_OTHER)
Admission: EM | Admit: 2022-05-19 | Discharge: 2022-05-19 | Disposition: A | Discharge status: Home / Self Care | Payer: Self-pay | Attending: Emergency Medicine | Admitting: Emergency Medicine

## 2022-05-19 DIAGNOSIS — J45901 Unspecified asthma with (acute) exacerbation: Secondary | ICD-10-CM | POA: Insufficient documentation

## 2022-05-19 DIAGNOSIS — R0602 Shortness of breath: Secondary | ICD-10-CM

## 2022-05-19 DIAGNOSIS — Z7951 Long term (current) use of inhaled steroids: Secondary | ICD-10-CM | POA: Insufficient documentation

## 2022-05-19 MED ORDER — IPRATROPIUM-ALBUTEROL 0.5-2.5 (3) MG/3ML IN SOLN
3.0000 mL | RESPIRATORY_TRACT | Status: DC
  Administered 2022-05-19: 3 mL via RESPIRATORY_TRACT
  Filled 2022-05-19: qty 3

## 2022-05-19 MED ORDER — PREDNISONE 20 MG PO TABS: 40.0000 mg | ORAL_TABLET | Freq: Every day | ORAL | 0 refills | Status: AC

## 2022-05-19 MED ORDER — ALBUTEROL SULFATE HFA 108 (90 BASE) MCG/ACT IN AERS
2.0000 | INHALATION_SPRAY | Freq: Once | RESPIRATORY_TRACT | Status: AC
  Administered 2022-05-19: 2 via RESPIRATORY_TRACT
  Filled 2022-05-19: qty 6.7

## 2022-05-19 MED ORDER — DEXAMETHASONE SODIUM PHOSPHATE 10 MG/ML IJ SOLN
10.0000 mg | Freq: Once | INTRAMUSCULAR | Status: AC
  Administered 2022-05-19: 10 mg via INTRAMUSCULAR
  Filled 2022-05-19: qty 1

## 2022-05-19 NOTE — Discharge Instructions (Addendum)
It was a pleasure taking care of you today!   Your chest x-ray did not show any concerning findings at this time.  You been provided with an albuterol inhaler today in the emergency department.  Be sent a prescription for prednisone, take as directed.  Continue to take your medications as prescribed.  Follow-up with your primary care provider regarding today's ED visit.  Return to emergency department if experience increasing/worsening symptoms.

## 2022-05-19 NOTE — ED Triage Notes (Signed)
C/o chest tightness and shortness of breath while at work today. Took inhaler without relief.   Bilateral breath sounds clear.

## 2022-05-19 NOTE — ED Provider Notes (Signed)
[facility] Provider Note   CSN: [id number] Arrival date & time: 05/19/22  1117     History  Chief Complaint  Patient presents with   Shortness of Breath    Cameron Caldwell is a 27 y.o. male with a PMHx of asthma who presents to the ED with concerns for shortness of breath onset PTA. Has chest tightness. Tried his inhaler without relief of his symptoms. Denies chest pain.  The history is provided by the patient. No language interpreter was used.       Home Medications Prior to Admission medications   Medication Sig Start Date End Date Taking? Authorizing Provider  albuterol (PROVENTIL) (2.5 MG/3ML) 0.083% nebulizer solution Take 3 mLs (2.5 mg total) by nebulization every 6 (six) hours as needed for wheezing or shortness of breath. 05/23/20   Gilda Crease, MD  albuterol (VENTOLIN HFA) 108 (90 Base) MCG/ACT inhaler Inhale 2 puffs into the lungs every 4 (four) hours as needed for wheezing or shortness of breath. 05/23/20   Gilda Crease, MD  albuterol (VENTOLIN HFA) 108 (90 Base) MCG/ACT inhaler Inhale 1-2 puffs into the lungs every 6 (six) hours as needed for wheezing or shortness of breath. 05/13/21   Palumbo, April, MD  fluticasone (FLONASE) 50 MCG/ACT nasal spray Place 1 spray into both nostrils daily. 08/13/19   Petrucelli, Samantha R, PA-C  hydrOXYzine (ATARAX/VISTARIL) 25 MG tablet Take 1 tablet (25 mg total) by mouth every 8 (eight) hours as needed for anxiety. 06/10/20   Lorelee New, PA-C  naproxen (NAPROSYN) 500 MG tablet TK 1 T PO BID 03/27/18   [provider]  omeprazole (PRILOSEC) 20 MG capsule Take 1 capsule (20 mg total) by mouth daily. 05/13/21   Palumbo, April, MD  omeprazole (PRILOSEC) 20 MG capsule Take 1 capsule (20 mg total) by mouth daily. 05/13/21   Palumbo, April, MD  oxyCODONE-acetaminophen (PERCOCET/ROXICET) 5-325 MG tablet TK 1 T PO D 03/27/18   [provider]  predniSONE (DELTASONE)  20 MG tablet Take 2 tablets (40 mg total) by mouth daily with breakfast. 05/19/22   Haim Hansson A, PA-C  QVAR REDIHALER 40 MCG/ACT inhaler INHALE 1 PUFF PO BID 10/28/17   [provider]      Allergies    Cat hair extract, Amoxicillin, and Bee pollen    Review of Systems   Review of Systems  Respiratory:  Positive for shortness of breath.   All other systems reviewed and are negative.   Physical Exam Updated Vital Signs BP 113/89 (BP Location: Left Arm)   Pulse 78   Temp 98.2 F (36.8 C) (Oral)   Resp 18   Ht 4\' 9"  (1.448 m)   Wt 59.4 kg   SpO2 98%   BMI 28.35 kg/m  Physical Exam Vitals and nursing note reviewed.  Constitutional:      General: He is not in acute distress.    Appearance: Normal appearance.     Comments: Able to speak in clear complete sentences.   Eyes:     General: No scleral icterus.    Extraocular Movements: Extraocular movements intact.  Cardiovascular:     Rate and Rhythm: Normal rate and regular rhythm.     Pulses: Normal pulses.     Heart sounds: Normal heart sounds.  Pulmonary:     Effort: Pulmonary effort is normal. No respiratory distress.     Breath sounds: Normal breath sounds.  Abdominal:     Palpations:  Abdomen is soft. There is no mass.     Tenderness: There is no abdominal tenderness.  Musculoskeletal:        General: Normal range of motion.     Cervical back: Neck supple.  Skin:    General: Skin is warm and dry.     Findings: No rash.  Neurological:     Mental Status: He is alert.     Sensory: Sensation is intact.     Motor: Motor function is intact.  Psychiatric:        Behavior: Behavior normal.     ED Results / Procedures / Treatments   Labs (all labs ordered are listed, but only abnormal results are displayed) Labs Reviewed - No data to display  EKG EKG Interpretation  Date/Time:  Wednesday May 19 2022 11:25:10 EDT Ventricular Rate:  68 PR Interval:  167 QRS Duration: 82 QT Interval:  351 QTC  Calculation: 374 R Axis:   93 Text Interpretation: Sinus rhythm Early repolarization No significant change since last tracing Confirmed by Ernie Avena ([id number]) on 05/19/2022 11:30:46 AM  Radiology DG Chest 2 View  Result Date: 05/19/2022 CLINICAL DATA:  Shortness of breath.  History of asthma EXAM: CHEST - 2 VIEW COMPARISON:  11/22/2021 and older FINDINGS: The heart size and mediastinal contours are within normal limits. Both lungs are clear. No consolidation, pneumothorax or effusion. No edema. The visualized skeletal structures are unremarkable. IMPRESSION: No acute cardiopulmonary disease Electronically Signed   By: Karen Kays M.D.   On: 05/19/2022 11:34    Procedures Procedures    Medications Ordered in ED Medications  ipratropium-albuterol (DUONEB) 0.5-2.5 (3) MG/3ML nebulizer solution 3 mL (3 mLs Nebulization Given 05/19/22 1225)  dexamethasone (DECADRON) injection 10 mg (10 mg Intramuscular Given 05/19/22 1225)  albuterol (VENTOLIN HFA) 108 (90 Base) MCG/ACT inhaler 2 puff (2 puffs Inhalation Given 05/19/22 1332)    ED Course/ Medical Decision Making/ A&P Clinical Course as of 05/19/22 1335  Wed May 19, 2022  1250 Re-evaluated and noted improvement of symptoms with treatment regimen in the ED.  Patient ambulatory in the emergency department with pulse ox 95% and asymptomatic.  Discussed with patient discharge treatment plan.  Answered all available questions.  Patient precipitation at this time. [SB]    Clinical Course User Index [SB] Marcee Jacobs A, PA-C                             Medical Decision Making Amount and/or Complexity of Data Reviewed Radiology: ordered.  Risk Prescription drug management.   Patient presents to the ED complaining of shortness of breath onset prior to arrival while at work.  Tried an inhaler without relief of his symptoms.  No chest pain at this time.  Patient does not wear oxygen at baseline.  Overall well-appearing during exam today.  On exam  patient with no acute cardiovascular respiratory exam findings.  No pitting edema noted bilaterally.  Differential diagnosis includes asthma exacerbation, pneumonia, PTX.  Co morbidities that complicate the patient evaluation: Asthma   Imaging: I ordered imaging studies including CXR I independently visualized and interpreted imaging which showed: no acute findings I agree with the radiologist interpretation  Medications:  I ordered medication including Decadron, DuoNeb for symptom management Reevaluation of the patient after these medicines and interventions, I reevaluated the patient and found that they have improved I have reviewed the patients home medicines and have made adjustments as needed  Disposition: Presenting  suspicious for likely asthma exacerbation.  Doubt concerns at this time for pneumonia, PTX.  Patient ambulatory in the emergency department without assistance or supplemental oxygen and noted oxygen to remain at 95% and asymptomatic.  After consideration of the diagnostic results and the patients response to treatment, I feel that the patient would benefit from Discharge home.  Patient sent home with a prescription for prednisone.  Work note provided.  Patient provided with albuterol inhaler in the emergency department.  Instructed patient to follow-up with primary care provider at this time.  Supportive care measures and strict return precautions discussed with patient at bedside. Pt acknowledges and verbalizes understanding. Pt appears safe for discharge. Follow up as indicated in discharge paperwork.    This chart was dictated using voice recognition software, Dragon. Despite the best efforts of this provider to proofread and correct errors, errors may still occur which can change documentation meaning.  Final Clinical Impression(s) / ED Diagnoses Final diagnoses:  Shortness of breath  Exacerbation of asthma, unspecified asthma severity, unspecified whether persistent     Rx / DC Orders ED Discharge Orders          Ordered    predniSONE (DELTASONE) 20 MG tablet  Daily with breakfast        05/19/22 1325              Mischell Branford A, PA-C 05/19/22 1452    Ernie Avena, MD 05/19/22 1946

## 2022-07-05 ENCOUNTER — Emergency Department (HOSPITAL_BASED_OUTPATIENT_CLINIC_OR_DEPARTMENT_OTHER): Payer: Self-pay

## 2022-07-05 ENCOUNTER — Emergency Department (HOSPITAL_BASED_OUTPATIENT_CLINIC_OR_DEPARTMENT_OTHER)
Admission: EM | Admit: 2022-07-05 | Discharge: 2022-07-05 | Disposition: A | Discharge status: Home / Self Care | Payer: Self-pay | Attending: Emergency Medicine | Admitting: Emergency Medicine

## 2022-07-05 ENCOUNTER — Encounter (HOSPITAL_BASED_OUTPATIENT_CLINIC_OR_DEPARTMENT_OTHER): Payer: Self-pay

## 2022-07-05 ENCOUNTER — Other Ambulatory Visit: Payer: Self-pay

## 2022-07-05 DIAGNOSIS — F419 Anxiety disorder, unspecified: Secondary | ICD-10-CM | POA: Insufficient documentation

## 2022-07-05 DIAGNOSIS — J45909 Unspecified asthma, uncomplicated: Secondary | ICD-10-CM | POA: Insufficient documentation

## 2022-07-05 DIAGNOSIS — R079 Chest pain, unspecified: Secondary | ICD-10-CM | POA: Insufficient documentation

## 2022-07-05 HISTORY — DX: Anxiety disorder, unspecified: F41.9

## 2022-07-05 LAB — BASIC METABOLIC PANEL
Anion gap: 6 (ref 5–15)
BUN: 16 mg/dL (ref 6–20)
CO2: 24 mmol/L (ref 22–32)
Calcium: 9.4 mg/dL (ref 8.9–10.3)
Chloride: 104 mmol/L (ref 98–111)
Creatinine, Ser: 0.94 mg/dL (ref 0.61–1.24)
GFR, Estimated: >60 mL/min (ref >60)
Glucose, Bld: 86 mg/dL (ref 70–99)
Potassium: 3.9 mmol/L (ref 3.5–5.1)
Sodium: 134 mmol/L — ABNORMAL LOW (ref 135–145)

## 2022-07-05 LAB — CBC
HCT: 43.8 % (ref 39.0–52.0)
Hemoglobin: 15.2 g/dL (ref 13.0–17.0)
MCH: 29 pg (ref 26.0–34.0)
MCHC: 34.7 g/dL (ref 30.0–36.0)
MCV: 83.6 fL (ref 80.0–100.0)
Platelets: 198 10*3/uL (ref 150–400)
RBC: 5.24 MIL/uL (ref 4.22–5.81)
RDW: 11.8 % (ref 11.5–15.5)
WBC: 4.7 10*3/uL (ref 4.0–10.5)
nRBC: 0 % (ref 0.0–0.2)

## 2022-07-05 LAB — TROPONIN I (HIGH SENSITIVITY): Troponin I (High Sensitivity): <2 ng/L (ref <18)

## 2022-07-05 MED ORDER — LORAZEPAM 1 MG PO TABS
0.5000 mg | ORAL_TABLET | Freq: Once | ORAL | Status: AC
  Administered 2022-07-05: 0.5 mg via ORAL
  Filled 2022-07-05: qty 1

## 2022-07-05 NOTE — ED Provider Notes (Signed)
[place] Provider Note   CSN: [id number] Arrival date & time: 07/05/22  1555     History  Chief Complaint  Patient presents with   Anxiety    Cameron Caldwell is a 26 y.o. male with medical history of achondroplasia, anxiety, asthma, back pain.  Patient presents to ED for evaluation of anxiety, chest pain and shortness of breath.  The patient states that over the last 5 weeks, ever since his friend died of a brain aneurysm, he has been very anxious.  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  He states that he developed shortness of breath and a centralized chest pain along with numbness in his arms and fast breathing after returning from lunch.  He denies any alcohol use, caffeine use at lunch.  He states that symptoms lasted for about 30 minutes then resolved.  He reports he was also dizzy and weak at this time.  He states that when he attempted to walk to the ambulance with EMS, his legs "buckled".  He denies any syncope, nausea, vomiting.  He denies medications prior to arrival.  He states his chest pain is centralized in nature and did not radiate.  He denies any chest pain currently.  The patient believes that his symptoms are secondary to anxiety.   Anxiety Associated symptoms include chest pain and shortness of breath.       Home Medications Prior to Admission medications   Medication Sig Start Date End Date Taking? Authorizing Provider  albuterol (PROVENTIL) (2.5 MG/3ML) 0.083% nebulizer solution Take 3 mLs (2.5 mg total) by nebulization every 6 (six) hours as needed for wheezing or shortness of breath. 05/23/20   Gilda Crease, MD  albuterol (VENTOLIN HFA) 108 (90 Base) MCG/ACT inhaler Inhale 2 puffs into the lungs every 4 (four) hours as needed for wheezing or shortness of breath. 05/23/20   Gilda Crease, MD  albuterol (VENTOLIN HFA) 108 (90 Base) MCG/ACT  inhaler Inhale 1-2 puffs into the lungs every 6 (six) hours as needed for wheezing or shortness of breath. 05/13/21   Palumbo, April, MD  fluticasone (FLONASE) 50 MCG/ACT nasal spray Place 1 spray into both nostrils daily. 08/13/19   Petrucelli, Samantha R, PA-C  hydrOXYzine (ATARAX/VISTARIL) 25 MG tablet Take 1 tablet (25 mg total) by mouth every 8 (eight) hours as needed for anxiety. 06/10/20   Lorelee New, PA-C  naproxen (NAPROSYN) 500 MG tablet TK 1 T PO BID 03/27/18   [provider]  omeprazole (PRILOSEC) 20 MG capsule Take 1 capsule (20 mg total) by mouth daily. 05/13/21   Palumbo, April, MD  omeprazole (PRILOSEC) 20 MG capsule Take 1 capsule (20 mg total) by mouth daily. 05/13/21   Palumbo, April, MD  oxyCODONE-acetaminophen (PERCOCET/ROXICET) 5-325 MG tablet TK 1 T PO D 03/27/18   [provider]  predniSONE (DELTASONE) 20 MG tablet Take 2 tablets (40 mg total) by mouth daily with breakfast. 05/19/22   Blue, Soijett A, PA-C  QVAR REDIHALER 40 MCG/ACT inhaler INHALE 1 PUFF PO BID 10/28/17   [provider]      Allergies    Cat hair extract, Amoxicillin, and Bee pollen    Review of Systems   Review of Systems  Respiratory:  Positive for shortness of breath.   Cardiovascular:  Positive for chest pain.  Neurological:  Positive for dizziness and weakness.  All other systems reviewed  and are negative.   Physical Exam Updated Vital Signs BP 129/79 (BP Location: Left Arm)   Pulse (!) 103   Temp 98.6 F (37 C)   Resp 18   Ht 4\' 9"  (1.448 m)   Wt 59 kg   SpO2 97%   BMI 28.13 kg/m  Physical Exam Vitals and nursing note reviewed.  Constitutional:      General: He is not in acute distress.    Appearance: Normal appearance. He is not ill-appearing, toxic-appearing or diaphoretic.  HENT:     Head: Normocephalic and atraumatic.     Nose: Nose normal.     Mouth/Throat:     Mouth: Mucous membranes are moist.     Pharynx: Oropharynx is clear.  Eyes:      Extraocular Movements: Extraocular movements intact.     Conjunctiva/sclera: Conjunctivae normal.     Pupils: Pupils are equal, round, and reactive to light.  Cardiovascular:     Rate and Rhythm: Normal rate and regular rhythm.  Pulmonary:     Effort: Pulmonary effort is normal.     Breath sounds: No wheezing.  Abdominal:     General: Abdomen is flat. Bowel sounds are normal.     Palpations: Abdomen is soft.     Tenderness: There is no abdominal tenderness.  Musculoskeletal:     Cervical back: Normal range of motion and neck supple. No tenderness.     Right lower leg: No edema.     Left lower leg: No edema.  Skin:    Capillary Refill: Capillary refill takes less than 2 seconds.  Neurological:     Mental Status: He is alert and oriented to person, place, and time.     ED Results / Procedures / Treatments   Labs (all labs ordered are listed, but only abnormal results are displayed) Labs Reviewed  BASIC METABOLIC PANEL - Abnormal; Notable for the following components:      Result Value   Sodium 134 (*)    All other components within normal limits  CBC  TROPONIN I (HIGH SENSITIVITY)    EKG EKG Interpretation  Date/Time:  Monday July 05 2022 16:09:38 EDT Ventricular Rate:  94 PR Interval:  157 QRS Duration: 95 QT Interval:  321 QTC Calculation: 402 R Axis:   97 Text Interpretation: Sinus rhythm Confirmed by Virgina Norfolk ([id number]) on 07/05/2022 4:10:58 PM  Radiology DG Chest Port 1 View  Result Date: 07/05/2022 CLINICAL DATA:  Chest pain and shortness of breath EXAM: PORTABLE CHEST 1 VIEW COMPARISON:  X-ray 05/19/2022 and older FINDINGS: No consolidation, pneumothorax or effusion. No edema. Normal cardiopericardial silhouette. Overlapping cardiac leads. IMPRESSION: No acute cardiopulmonary disease. Electronically Signed   By: Karen Kays M.D.   On: 07/05/2022 18:33    Procedures Procedures   Medications Ordered in ED Medications  LORazepam (ATIVAN) tablet 0.5 mg (0.5  mg Oral Given 07/05/22 1759)    ED Course/ Medical Decision Making/ A&P  Medical Decision Making  27 year old male presents to ED for evaluation of anxiety, chest pain and shortness of breath.  Please see HPI for further details.  On examination patient is afebrile, tachycardic to 103.  His lung sounds are clear bilaterally and he is not hypoxic.  Abdomen soft and compressible throughout.  No edema to bilateral lower extremities.  Neurological examination unremarkable.  Patient overall nontoxic in appearance.  Patient CBC without cytosis or anemia.  Metabolic panel with sodium 134, no other electrolyte derangement, no elevated creatinine, anion gap 6.  Patient troponin less than 2 and he denies any active chest pain at this time.  His chest x-ray is unremarkable without consolidations or effusions.  His EKG is nonischemic.  Patient given 0.5 mg of Ativan.  On reassessment, patient reports he feels much better.  Denies chest pain or shortness of breath.  He states he feels as if his symptoms earlier were secondary to his anxiety.  He states he has tried taking medication such as hydroxyzine in the past but they just make him tired.  He was on Klonopin at 1 point but he did not want to get addicted so he stopped taking it.  The patient will be discharged at this time and he will follow-up with his PCP for further management.  He was advised to return to the ED with any new or worsening signs or symptoms and he voiced understanding.  He had all of his questions answered to his satisfaction.  He is stable to discharge home at this time.  Patient case discussed with attending Dr. Lockie Mola who voices agreement with plan of management.   Final Clinical Impression(s) / ED Diagnoses Final diagnoses:  Chest pain, unspecified type  Anxiety    Rx / DC Orders ED Discharge Orders     None         Al Decant, PA-C 07/05/22 1858    Virgina Norfolk, DO 07/05/22 2319

## 2022-07-05 NOTE — ED Triage Notes (Signed)
Pt BIB EMS after having anxiety attack while at work. Pt hx has anxiety. Pt denies CP. Per pt, he got flush/hot, shaky, and extremities felt heavy during anxiety attack. Per pt, he feels like his heart is racing.

## 2022-07-05 NOTE — Discharge Instructions (Signed)
Please return to the ED with any new or worsening symptoms such as return of chest pain Please follow-up with your PCP for further management and reevaluation Please read the attached guide about managing anxiety as an adult as well as mindfulness based stress reduction

## 2022-12-14 ENCOUNTER — Encounter (HOSPITAL_BASED_OUTPATIENT_CLINIC_OR_DEPARTMENT_OTHER): Payer: Self-pay | Admitting: Emergency Medicine

## 2022-12-14 ENCOUNTER — Other Ambulatory Visit: Payer: Self-pay

## 2022-12-14 ENCOUNTER — Emergency Department (HOSPITAL_BASED_OUTPATIENT_CLINIC_OR_DEPARTMENT_OTHER)
Admission: EM | Admit: 2022-12-14 | Discharge: 2022-12-14 | Disposition: A | Discharge status: Home / Self Care | Payer: Self-pay | Attending: Emergency Medicine | Admitting: Emergency Medicine

## 2022-12-14 ENCOUNTER — Emergency Department (HOSPITAL_BASED_OUTPATIENT_CLINIC_OR_DEPARTMENT_OTHER): Payer: Self-pay

## 2022-12-14 DIAGNOSIS — R0602 Shortness of breath: Secondary | ICD-10-CM | POA: Insufficient documentation

## 2022-12-14 DIAGNOSIS — R109 Unspecified abdominal pain: Secondary | ICD-10-CM | POA: Insufficient documentation

## 2022-12-14 DIAGNOSIS — F410 Panic disorder [episodic paroxysmal anxiety] without agoraphobia: Secondary | ICD-10-CM

## 2022-12-14 DIAGNOSIS — F419 Anxiety disorder, unspecified: Secondary | ICD-10-CM | POA: Insufficient documentation

## 2022-12-14 LAB — COMPREHENSIVE METABOLIC PANEL
ALT: 17 U/L (ref 0–44)
AST: 15 U/L (ref 15–41)
Albumin: 4.3 g/dL (ref 3.5–5.0)
Alkaline Phosphatase: 46 U/L (ref 38–126)
Anion gap: 5 (ref 5–15)
BUN: 12 mg/dL (ref 6–20)
CO2: 28 mmol/L (ref 22–32)
Calcium: 10 mg/dL (ref 8.9–10.3)
Chloride: 104 mmol/L (ref 98–111)
Creatinine, Ser: 0.89 mg/dL (ref 0.61–1.24)
GFR, Estimated: >60 mL/min (ref >60)
Glucose, Bld: 99 mg/dL (ref 70–99)
Potassium: 4.1 mmol/L (ref 3.5–5.1)
Sodium: 137 mmol/L (ref 135–145)
Total Bilirubin: 0.4 mg/dL (ref <1.2)
Total Protein: 6.7 g/dL (ref 6.5–8.1)

## 2022-12-14 LAB — CBC WITH DIFFERENTIAL/PLATELET
Abs Immature Granulocytes: 0.01 10*3/uL (ref 0.00–0.07)
Basophils Absolute: 0 10*3/uL (ref 0.0–0.1)
Basophils Relative: 1 %
Eosinophils Absolute: 0.1 10*3/uL (ref 0.0–0.5)
Eosinophils Relative: 4 %
HCT: 43.7 % (ref 39.0–52.0)
Hemoglobin: 14.9 g/dL (ref 13.0–17.0)
Immature Granulocytes: 0 %
Lymphocytes Relative: 41 %
Lymphs Abs: 1.6 10*3/uL (ref 0.7–4.0)
MCH: 29.2 pg (ref 26.0–34.0)
MCHC: 34.1 g/dL (ref 30.0–36.0)
MCV: 85.7 fL (ref 80.0–100.0)
Monocytes Absolute: 0.5 10*3/uL (ref 0.1–1.0)
Monocytes Relative: 13 %
Neutro Abs: 1.5 10*3/uL — ABNORMAL LOW (ref 1.7–7.7)
Neutrophils Relative %: 41 %
Platelets: 187 10*3/uL (ref 150–400)
RBC: 5.1 MIL/uL (ref 4.22–5.81)
RDW: 12.1 % (ref 11.5–15.5)
WBC: 3.8 10*3/uL — ABNORMAL LOW (ref 4.0–10.5)
nRBC: 0 % (ref 0.0–0.2)

## 2022-12-14 LAB — URINALYSIS, ROUTINE W REFLEX MICROSCOPIC
Bilirubin Urine: NEGATIVE
Glucose, UA: NEGATIVE mg/dL
Hgb urine dipstick: NEGATIVE
Ketones, ur: NEGATIVE mg/dL
Leukocytes,Ua: NEGATIVE
Nitrite: NEGATIVE
Protein, ur: NEGATIVE mg/dL
Specific Gravity, Urine: 1.025 (ref 1.005–1.030)
pH: 6.5 (ref 5.0–8.0)

## 2022-12-14 LAB — TROPONIN I (HIGH SENSITIVITY): Troponin I (High Sensitivity): <2 ng/L (ref <18)

## 2022-12-14 LAB — LIPASE, BLOOD: Lipase: 17 U/L (ref 11–51)

## 2022-12-14 MED ORDER — ALBUTEROL SULFATE HFA 108 (90 BASE) MCG/ACT IN AERS
2.0000 | INHALATION_SPRAY | RESPIRATORY_TRACT | Status: DC | PRN
  Administered 2022-12-14: 2 via RESPIRATORY_TRACT
  Filled 2022-12-14: qty 6.7

## 2022-12-14 MED ORDER — LORAZEPAM 1 MG PO TABS
0.5000 mg | ORAL_TABLET | Freq: Once | ORAL | Status: AC
  Administered 2022-12-14: 0.5 mg via ORAL
  Filled 2022-12-14: qty 1

## 2022-12-14 MED ORDER — LACTATED RINGERS IV BOLUS
1000.0000 mL | Freq: Once | INTRAVENOUS | Status: AC
  Administered 2022-12-14: 1000 mL via INTRAVENOUS

## 2022-12-14 NOTE — Discharge Instructions (Addendum)
Workup today was reassuring.  You felt better after medicine in the emergency department.  Follow-up with your primary care provider.  Return for any concerning symptoms.

## 2022-12-14 NOTE — ED Triage Notes (Signed)
Right flank pain x 3 days , no urinary symptoms .  Also reports anxiety feelings x 2 days , shortness of breath and mid chest pressure .

## 2022-12-14 NOTE — ED Provider Notes (Signed)
[facility] Provider Note   CSN: [id number] Arrival date & time: 12/14/22  1009     History  Chief Complaint  Patient presents with   Flank Pain    right    Cameron Caldwell is a 27 y.o. male.  27 year old male presents today for concern of flank pain has been ongoing for 3 days but more importantly he states he developed some shortness of breath and anxiety while at work and left work to come get evaluated.   The history is provided by the patient. No language interpreter was used.       Home Medications Prior to Admission medications   Medication Sig Start Date End Date Taking? Authorizing Provider  albuterol (PROVENTIL) (2.5 MG/3ML) 0.083% nebulizer solution Take 3 mLs (2.5 mg total) by nebulization every 6 (six) hours as needed for wheezing or shortness of breath. 05/23/20   Gilda Crease, MD  albuterol (VENTOLIN HFA) 108 (90 Base) MCG/ACT inhaler Inhale 2 puffs into the lungs every 4 (four) hours as needed for wheezing or shortness of breath. 05/23/20   Gilda Crease, MD  albuterol (VENTOLIN HFA) 108 (90 Base) MCG/ACT inhaler Inhale 1-2 puffs into the lungs every 6 (six) hours as needed for wheezing or shortness of breath. 05/13/21   Palumbo, April, MD  fluticasone (FLONASE) 50 MCG/ACT nasal spray Place 1 spray into both nostrils daily. 08/13/19   Petrucelli, Samantha R, PA-C  hydrOXYzine (ATARAX/VISTARIL) 25 MG tablet Take 1 tablet (25 mg total) by mouth every 8 (eight) hours as needed for anxiety. 06/10/20   Lorelee New, PA-C  naproxen (NAPROSYN) 500 MG tablet TK 1 T PO BID 03/27/18   [provider]  omeprazole (PRILOSEC) 20 MG capsule Take 1 capsule (20 mg total) by mouth daily. 05/13/21   Palumbo, April, MD  omeprazole (PRILOSEC) 20 MG capsule Take 1 capsule (20 mg total) by mouth daily. 05/13/21   Palumbo, April, MD  oxyCODONE-acetaminophen (PERCOCET/ROXICET) 5-325 MG tablet TK 1 T PO D 03/27/18    [provider]  predniSONE (DELTASONE) 20 MG tablet Take 2 tablets (40 mg total) by mouth daily with breakfast. 05/19/22   Blue, Soijett A, PA-C  QVAR REDIHALER 40 MCG/ACT inhaler INHALE 1 PUFF PO BID 10/28/17   [provider]      Allergies    Cat hair extract, Amoxicillin, and Bee pollen    Review of Systems   Review of Systems  Constitutional:  Negative for chills and fever.  Respiratory:  Positive for shortness of breath.   Cardiovascular:  Negative for chest pain and palpitations.  Gastrointestinal:  Negative for abdominal pain, nausea and vomiting.  Genitourinary:  Negative for difficulty urinating, dysuria and flank pain.  Psychiatric/Behavioral:  The patient is nervous/anxious.   All other systems reviewed and are negative.   Physical Exam Updated Vital Signs BP (!) 142/89   Pulse (!) 104   Temp 99 F (37.2 C)   Resp (!) 21   Ht 4\' 9"  (1.448 m)   Wt 62.1 kg   SpO2 97%   BMI 29.65 kg/m  Physical Exam Vitals and nursing note reviewed.  Constitutional:      General: He is not in acute distress.    Appearance: Normal appearance. He is not ill-appearing.  HENT:     Head: Normocephalic and atraumatic.     Nose: Nose normal.  Eyes:     Conjunctiva/sclera: Conjunctivae normal.  Cardiovascular:  Rate and Rhythm: Normal rate and regular rhythm.     Heart sounds: Normal heart sounds.  Pulmonary:     Effort: Pulmonary effort is normal. No respiratory distress.     Breath sounds: Normal breath sounds. No wheezing.  Abdominal:     General: There is no distension.     Tenderness: There is no abdominal tenderness. There is no right CVA tenderness, left CVA tenderness or guarding.  Musculoskeletal:        General: No deformity. Normal range of motion.     Cervical back: Normal range of motion.  Skin:    Findings: No rash.  Neurological:     Mental Status: He is alert.     ED Results / Procedures / Treatments   Labs (all labs ordered are  listed, but only abnormal results are displayed) Labs Reviewed  CBC WITH DIFFERENTIAL/PLATELET - Abnormal; Notable for the following components:      Result Value   WBC 3.8 (*)    Neutro Abs 1.5 (*)    All other components within normal limits  URINALYSIS, ROUTINE W REFLEX MICROSCOPIC  COMPREHENSIVE METABOLIC PANEL  LIPASE, BLOOD  TROPONIN I (HIGH SENSITIVITY)    EKG None  Radiology No results found.  Procedures Procedures    Medications Ordered in ED Medications  albuterol (VENTOLIN HFA) 108 (90 Base) MCG/ACT inhaler 2 puff (2 puffs Inhalation Given 12/14/22 1044)  lactated ringers bolus 1,000 mL (1,000 mLs Intravenous New Bag/Given 12/14/22 1141)  LORazepam (ATIVAN) tablet 0.5 mg (0.5 mg Oral Given 12/14/22 1123)    ED Course/ Medical Decision Making/ A&P                                 Medical Decision Making Amount and/or Complexity of Data Reviewed Labs: ordered. Radiology: ordered.  Risk Prescription drug management.   27 year old male presents today for concern of panic attack.  Endorses shortness of breath that came on this morning.  Does not have anything on hand to take for as needed anxiety attacks.  Also endorses right-sided flank pain has been gone over the past 3 days.  Is mild.  No urinary symptoms.  No history of kidney stones.  Currently without flank pain.  CBC shows no leukocytosis, no anemia.  CMP unremarkable.  Troponin undetectable.  Lipase within normal.  UA unremarkable.  Chest x-ray without acute cardiopulmonary process.  EKG without acute ischemic change.  Feels better after Ativan.  Discussed close follow-up with PCP.  Return precaution discussed.  Patient discharged in stable condition.   Low heart score.  Low suspicion for ACS.  Likely anxiety attack.  Low risk for PE on Wells criteria.  Final Clinical Impression(s) / ED Diagnoses Final diagnoses:  Anxiety attack  Flank pain    Rx / DC Orders ED Discharge Orders     None          Marita Kansas, PA-C 12/14/22 1458    Ernie Avena, MD 12/14/22 1901

## 2023-01-25 ENCOUNTER — Emergency Department (HOSPITAL_BASED_OUTPATIENT_CLINIC_OR_DEPARTMENT_OTHER)
Admission: EM | Admit: 2023-01-25 | Discharge: 2023-01-25 | Disposition: A | Discharge status: Home / Self Care | Payer: No Typology Code available for payment source | Attending: Emergency Medicine | Admitting: Emergency Medicine

## 2023-01-25 ENCOUNTER — Other Ambulatory Visit: Payer: Self-pay

## 2023-01-25 ENCOUNTER — Emergency Department (HOSPITAL_BASED_OUTPATIENT_CLINIC_OR_DEPARTMENT_OTHER): Payer: No Typology Code available for payment source

## 2023-01-25 ENCOUNTER — Encounter (HOSPITAL_BASED_OUTPATIENT_CLINIC_OR_DEPARTMENT_OTHER): Payer: Self-pay

## 2023-01-25 DIAGNOSIS — I319 Disease of pericardium, unspecified: Secondary | ICD-10-CM

## 2023-01-25 DIAGNOSIS — I309 Acute pericarditis, unspecified: Secondary | ICD-10-CM | POA: Insufficient documentation

## 2023-01-25 DIAGNOSIS — R079 Chest pain, unspecified: Secondary | ICD-10-CM | POA: Diagnosis present

## 2023-01-25 LAB — BASIC METABOLIC PANEL
Anion gap: 8 (ref 5–15)
BUN: 16 mg/dL (ref 6–20)
CO2: 19 mmol/L — ABNORMAL LOW (ref 22–32)
Calcium: 9.4 mg/dL (ref 8.9–10.3)
Chloride: 107 mmol/L (ref 98–111)
Creatinine, Ser: 0.78 mg/dL (ref 0.61–1.24)
GFR, Estimated: >60 mL/min (ref >60)
Glucose, Bld: 93 mg/dL (ref 70–99)
Potassium: 4 mmol/L (ref 3.5–5.1)
Sodium: 134 mmol/L — ABNORMAL LOW (ref 135–145)

## 2023-01-25 LAB — CBC
HCT: 41.9 % (ref 39.0–52.0)
Hemoglobin: 14.7 g/dL (ref 13.0–17.0)
MCH: 29.1 pg (ref 26.0–34.0)
MCHC: 35.1 g/dL (ref 30.0–36.0)
MCV: 83 fL (ref 80.0–100.0)
Platelets: 184 10*3/uL (ref 150–400)
RBC: 5.05 MIL/uL (ref 4.22–5.81)
RDW: 11.9 % (ref 11.5–15.5)
WBC: 3.6 10*3/uL — ABNORMAL LOW (ref 4.0–10.5)
nRBC: 0 % (ref 0.0–0.2)

## 2023-01-25 LAB — TROPONIN I (HIGH SENSITIVITY)
Troponin I (High Sensitivity): <2 ng/L (ref <18)
Troponin I (High Sensitivity): <2 ng/L (ref <18)

## 2023-01-25 IMAGING — DX DG CHEST 1V PORT
1 series · 1 of 1 positions shown · non-contrast
Comparison: 01/21/2021

CLINICAL DATA: Chest pain, shortness of breath

EXAM:
PORTABLE CHEST 1 VIEW

[chest ap]
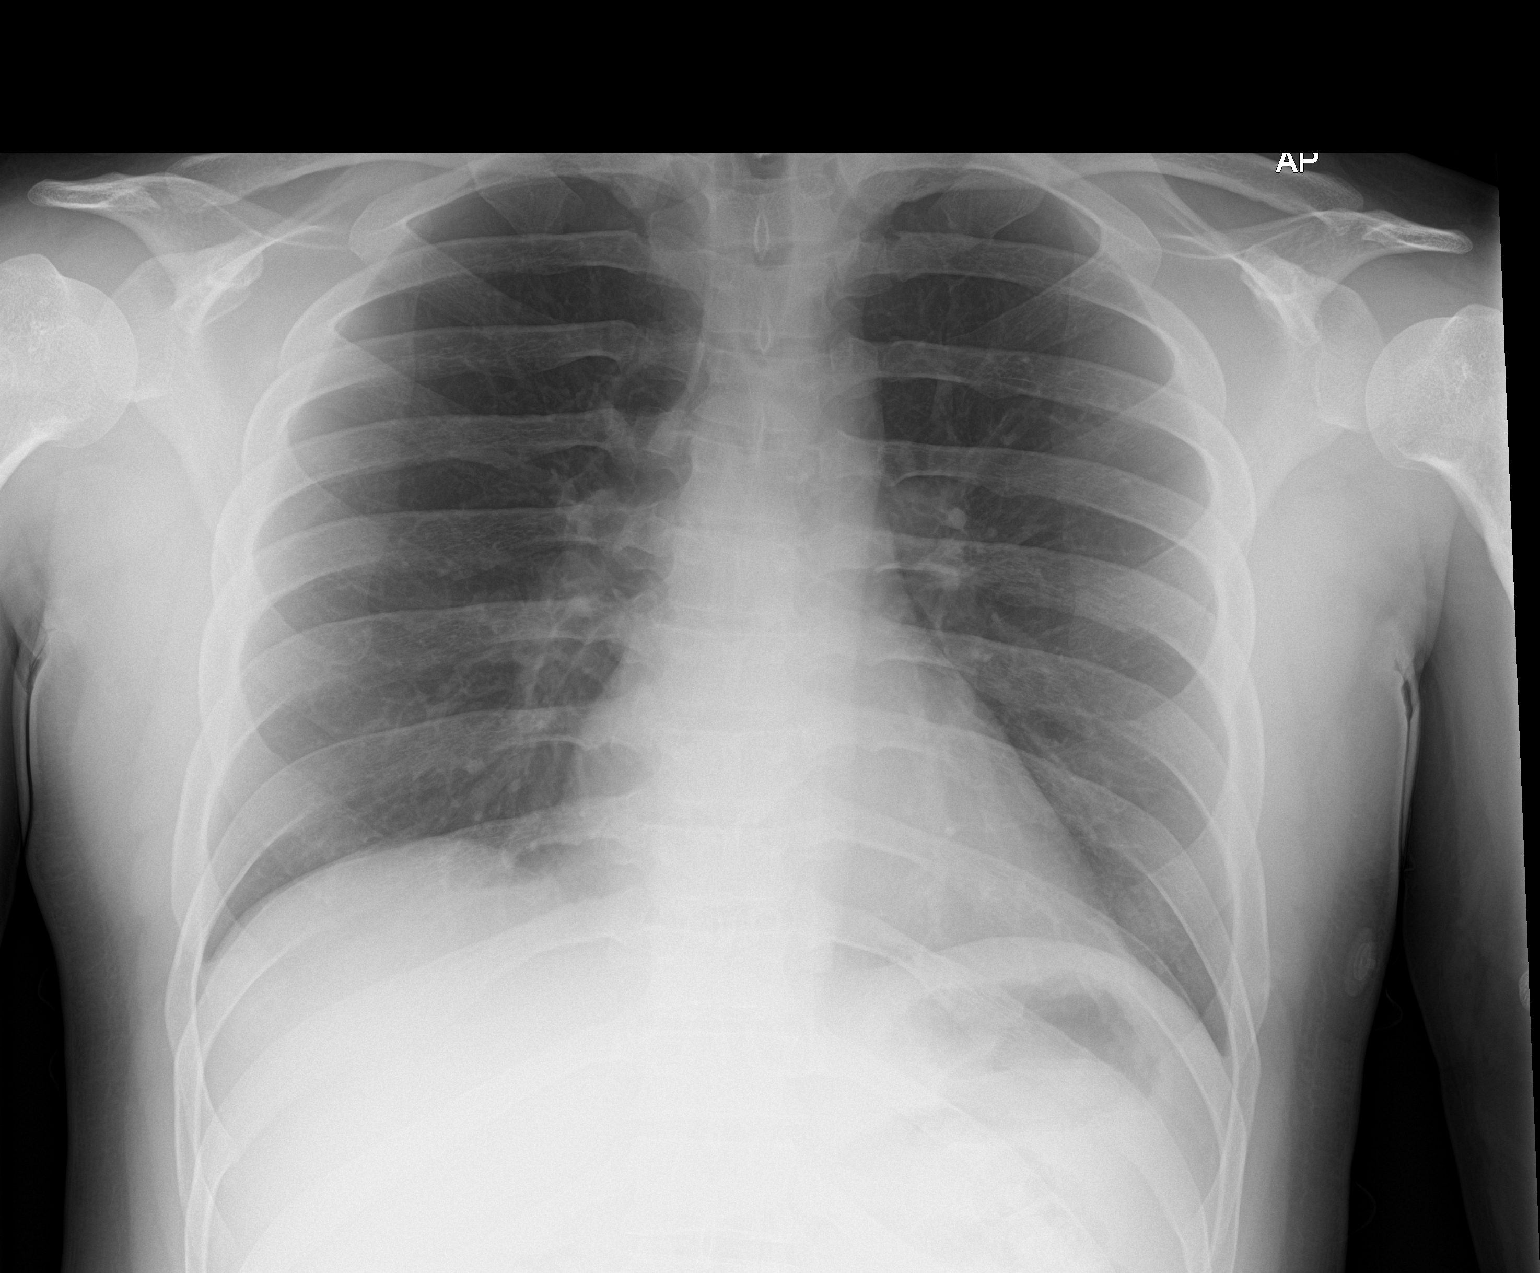

[1 of 1 positions shown; findings below may reference images not displayed]

FINDINGS: The heart size and mediastinal contours are within normal limits.
Both lungs are clear. The visualized skeletal structures are
unremarkable.
IMPRESSION: Normal study.

## 2023-01-25 MED ORDER — IBUPROFEN 600 MG PO TABS: 600.0000 mg | ORAL_TABLET | Freq: Three times a day (TID) | ORAL | 0 refills | Status: AC

## 2023-01-25 NOTE — Discharge Instructions (Signed)
 Due to your recent upper respiratory infection, complaints of chest pain, and EKG findings there is a concern for possible pericarditis.  This is a inflammatory state of the heart that occurs after a viral infection.  This is usually self-limiting and improves with NSAIDs or nonsteroidal anti-inflammatory medication such as Motrin .  I have sent a prescription over to your pharmacy however you can take over-the-counter medication as well.  Most over-the-counter Motrin  is 200 mg.  You can take 600 mg (3 pills) every 8 hours for the next 3 days.

## 2023-01-25 NOTE — ED Triage Notes (Addendum)
 Pt reports left sided chest pain that radiated to left shoulder and left upper arm. Pt was driving at the time and became nauseated. SOB at the time. Cold symptoms and cough last week

## 2023-01-25 NOTE — ED Provider Notes (Signed)
 [place] [facility] AT [facility] Provider Note   CSN: [id number] Arrival date & time: 01/25/23  9040     History  Chief Complaint  Patient presents with   Chest Pain    Cameron Caldwell is a 28 y.o. male.  Patient is a 28 year old male with past medical history of achondroplasia presenting for complaints of chest pain.  Patient admits to left-sided chest pain with radiation down the left arm with associated nausea that lasted approximately 15 minutes and then improved that occurred while he was driving this morning at approximately 9 AM.  States chest pain is no longer present.  Describes chest pain as pressure-like and squeezing.  Does admit to recent upper respiratory-like infection with nasal congestion, rhinorrhea, and cough that is improving.  Chest pain was not positional in nature. Denies any fevers.  Denies lower extremity swelling or orthopnea.  Denies previous history of DVT, PE, or lower extremity calf pain//swelling.  The history is provided by the patient. No language interpreter was used.  Chest Pain Associated symptoms: no abdominal pain, no back pain, no cough, no fever, no palpitations, no shortness of breath and no vomiting        Home Medications Prior to Admission medications   Medication Sig Start Date End Date Taking? Authorizing Provider  ibuprofen  (ADVIL ) 600 MG tablet Take 1 tablet (600 mg total) by mouth every 8 (eight) hours for 3 days. 01/25/23 01/28/23 Yes Elnor Hila P, DO  albuterol  (PROVENTIL ) (2.5 MG/3ML) 0.083% nebulizer solution Take 3 mLs (2.5 mg total) by nebulization every 6 (six) hours as needed for wheezing or shortness of breath. 05/23/20   Haze Lonni PARAS, MD  albuterol  (VENTOLIN  HFA) 108 (90 Base) MCG/ACT inhaler Inhale 2 puffs into the lungs every 4 (four) hours as needed for wheezing or shortness of breath. 05/23/20   Pollina, Christopher J, MD  albuterol  (VENTOLIN  HFA) 108 (90 Base) MCG/ACT inhaler Inhale 1-2 puffs  into the lungs every 6 (six) hours as needed for wheezing or shortness of breath. 05/13/21   Palumbo, April, MD  fluticasone  (FLONASE ) 50 MCG/ACT nasal spray Place 1 spray into both nostrils daily. 08/13/19   Petrucelli, Samantha R, PA-C  hydrOXYzine  (ATARAX /VISTARIL ) 25 MG tablet Take 1 tablet (25 mg total) by mouth every 8 (eight) hours as needed for anxiety. 06/10/20   Landy Honora CROME, PA-C  naproxen (NAPROSYN) 500 MG tablet TK 1 T PO BID 03/27/18   [provider]  omeprazole  (PRILOSEC) 20 MG capsule Take 1 capsule (20 mg total) by mouth daily. 05/13/21   Palumbo, April, MD  omeprazole  (PRILOSEC) 20 MG capsule Take 1 capsule (20 mg total) by mouth daily. 05/13/21   Palumbo, April, MD  oxyCODONE-acetaminophen  (PERCOCET/ROXICET) 5-325 MG tablet TK 1 T PO D 03/27/18   [provider]  predniSONE  (DELTASONE ) 20 MG tablet Take 2 tablets (40 mg total) by mouth daily with breakfast. 05/19/22   Blue, Soijett A, PA-C  QVAR REDIHALER 40 MCG/ACT inhaler INHALE 1 PUFF PO BID 10/28/17   [provider]      Allergies    Cat hair extract, Amoxicillin, and Bee pollen    Review of Systems   Review of Systems  Constitutional:  Negative for chills and fever.  HENT:  Negative for ear pain and sore throat.   Eyes:  Negative for pain and visual disturbance.  Respiratory:  Negative for cough and shortness of breath.   Cardiovascular:  Positive for chest pain. Negative for palpitations.  Gastrointestinal:  Negative for abdominal pain and vomiting.  Genitourinary:  Negative for dysuria and hematuria.  Musculoskeletal:  Negative for arthralgias and back pain.  Skin:  Negative for color change and rash.  Neurological:  Negative for seizures and syncope.  All other systems reviewed and are negative.   Physical Exam Updated Vital Signs BP (!) 153/99   Pulse 65   Temp 98.4 F (36.9 C)   Resp 18   SpO2 99%  Physical Exam Vitals and nursing note reviewed.  Constitutional:      General:  He is not in acute distress.    Appearance: He is well-developed.  HENT:     Head: Normocephalic and atraumatic.  Eyes:     Conjunctiva/sclera: Conjunctivae normal.  Cardiovascular:     Rate and Rhythm: Normal rate and regular rhythm.     Heart sounds: No murmur heard. Pulmonary:     Effort: Pulmonary effort is normal. No respiratory distress.     Breath sounds: Normal breath sounds.  Abdominal:     Palpations: Abdomen is soft.     Tenderness: There is no abdominal tenderness.  Musculoskeletal:        General: No swelling.     Cervical back: Neck supple.  Skin:    General: Skin is warm and dry.     Capillary Refill: Capillary refill takes less than 2 seconds.  Neurological:     Mental Status: He is alert.  Psychiatric:        Mood and Affect: Mood normal.     ED Results / Procedures / Treatments   Labs (all labs ordered are listed, but only abnormal results are displayed) Labs Reviewed  BASIC METABOLIC PANEL - Abnormal; Notable for the following components:      Result Value   Sodium 134 (*)    CO2 19 (*)    All other components within normal limits  CBC - Abnormal; Notable for the following components:   WBC 3.6 (*)    All other components within normal limits  TROPONIN I (HIGH SENSITIVITY)  TROPONIN I (HIGH SENSITIVITY)    EKG EKG Interpretation Date/Time:  Tuesday January 25 2023 10:11:56 EST Ventricular Rate:  74 PR Interval:  167 QRS Duration:  88 QT Interval:  351 QTC Calculation: 390 R Axis:   91  Text Interpretation: Sinus rhythm Borderline right axis deviation minimal ST elevation suggests acute pericarditis Confirmed by Elnor Hila ([id number]) on 01/25/2023 11:40:00 AM  Radiology DG Chest 2 View Result Date: 01/25/2023 CLINICAL DATA:  Left-sided chest pain. EXAM: CHEST - 2 VIEW COMPARISON:  12/14/2022. FINDINGS: Bilateral lung fields are clear. Bilateral costophrenic angles are clear. Normal cardio-mediastinal silhouette. No acute osseous abnormalities. The  soft tissues are within normal limits. IMPRESSION: No active cardiopulmonary disease. Electronically Signed   By: Ree Molt M.D.   On: 01/25/2023 10:54    Procedures Procedures    Medications Ordered in ED Medications - No data to display  ED Course/ Medical Decision Making/ A&P                                 Medical Decision Making Amount and/or Complexity of Data Reviewed Labs: ordered. Radiology: ordered.  Risk Prescription drug management.   41:43 AM 28 year old male with past medical history of achondroplasia presenting for complaints of chest pain.  Patient is alert and oriented x 3, no acute distress, afebrile, stable vital signs.  Equal bilateral  breath sounds with no adventitious lung sounds.  No respiratory distress.  No chest wall tenderness.   EKG as interpreted by myself demonstrates sinus rhythm with a stable rate.  Minimal diffuse ST segment elevation possibly pericarditis. Stable intervals.  The patient's chest pain is not suggestive of pulmonary embolus, cardiac ischemia, aortic dissection, pulmonary embolism, pneumothorax, pneumonia, Zoster, or esophageal perforation, or other serious etiology.  Historically not tearing or ripping, pulses symmetric. EKG nonspecific for ischemia/infarction. No dysrhythmias, brugada, WPW, prolonged QT noted. CXR reviewed and WNL. Troponin negative x2.   With history of recent URI, chest pain, and EKG suggesting possible pericarditis with will recommend NSAID use and close follow-up with PCP.  Otherwise no respiratory distress.  No pleural effusions.  Bedside ultrasound straits no pericardial effusions.  No shortness of breath or hypoxia.  Stable vitals.  Patient in no distress and overall condition improved here in the ED. Detailed discussions were had with the patient regarding current findings, and need for close f/u with PCP or on call doctor. The patient has been instructed to return immediately if the symptoms worsen in any  way for re-evaluation. Patient verbalized understanding and is in agreement with current care plan. All questions answered prior to discharge.          Final Clinical Impression(s) / ED Diagnoses Final diagnoses:  Pericarditis, unspecified chronicity, unspecified type    Rx / DC Orders ED Discharge Orders          Ordered    ibuprofen  (ADVIL ) 600 MG tablet  Every 8 hours        01/25/23 1318              Elnor Hila [place], DO 01/27/23 [phone]

## 2023-02-18 ENCOUNTER — Emergency Department (HOSPITAL_BASED_OUTPATIENT_CLINIC_OR_DEPARTMENT_OTHER)
Admission: EM | Admit: 2023-02-18 | Discharge: 2023-02-18 | Disposition: A | Discharge status: Home / Self Care | Payer: No Typology Code available for payment source | Attending: Emergency Medicine | Admitting: Emergency Medicine

## 2023-02-18 ENCOUNTER — Encounter (HOSPITAL_BASED_OUTPATIENT_CLINIC_OR_DEPARTMENT_OTHER): Payer: Self-pay | Admitting: Urology

## 2023-02-18 ENCOUNTER — Other Ambulatory Visit: Payer: Self-pay

## 2023-02-18 ENCOUNTER — Emergency Department (HOSPITAL_BASED_OUTPATIENT_CLINIC_OR_DEPARTMENT_OTHER): Payer: No Typology Code available for payment source

## 2023-02-18 DIAGNOSIS — R072 Precordial pain: Secondary | ICD-10-CM | POA: Diagnosis not present

## 2023-02-18 DIAGNOSIS — R079 Chest pain, unspecified: Secondary | ICD-10-CM | POA: Diagnosis present

## 2023-02-18 DIAGNOSIS — J45909 Unspecified asthma, uncomplicated: Secondary | ICD-10-CM | POA: Diagnosis not present

## 2023-02-18 DIAGNOSIS — R110 Nausea: Secondary | ICD-10-CM | POA: Insufficient documentation

## 2023-02-18 LAB — BASIC METABOLIC PANEL
Anion gap: 10 (ref 5–15)
BUN: 14 mg/dL (ref 6–20)
CO2: 22 mmol/L (ref 22–32)
Calcium: 9.2 mg/dL (ref 8.9–10.3)
Chloride: 105 mmol/L (ref 98–111)
Creatinine, Ser: 0.7 mg/dL (ref 0.61–1.24)
GFR, Estimated: >60 mL/min (ref >60)
Glucose, Bld: 87 mg/dL (ref 70–99)
Potassium: 3.8 mmol/L (ref 3.5–5.1)
Sodium: 137 mmol/L (ref 135–145)

## 2023-02-18 LAB — CBC
HCT: 38.9 % — ABNORMAL LOW (ref 39.0–52.0)
Hemoglobin: 13.7 g/dL (ref 13.0–17.0)
MCH: 29.1 pg (ref 26.0–34.0)
MCHC: 35.2 g/dL (ref 30.0–36.0)
MCV: 82.8 fL (ref 80.0–100.0)
Platelets: 170 10*3/uL (ref 150–400)
RBC: 4.7 MIL/uL (ref 4.22–5.81)
RDW: 11.9 % (ref 11.5–15.5)
WBC: 3.8 10*3/uL — ABNORMAL LOW (ref 4.0–10.5)
nRBC: 0 % (ref 0.0–0.2)

## 2023-02-18 LAB — TROPONIN I (HIGH SENSITIVITY): Troponin I (High Sensitivity): <2 ng/L (ref <18)

## 2023-02-18 LAB — D-DIMER, QUANTITATIVE: D-Dimer, Quant: <0.27 ug{FEU}/mL (ref 0.00–0.50)

## 2023-02-18 MED ORDER — PANTOPRAZOLE SODIUM 40 MG PO TBEC: 40.0000 mg | DELAYED_RELEASE_TABLET | Freq: Every day | ORAL | 0 refills | Status: AC

## 2023-02-18 MED ORDER — KETOROLAC TROMETHAMINE 30 MG/ML IJ SOLN
30.0000 mg | Freq: Once | INTRAMUSCULAR | Status: AC
  Administered 2023-02-18: 30 mg via INTRAVENOUS
  Filled 2023-02-18: qty 1

## 2023-02-18 MED ORDER — ONDANSETRON HCL 4 MG/2ML IJ SOLN
4.0000 mg | Freq: Once | INTRAMUSCULAR | Status: AC
  Administered 2023-02-18: 4 mg via INTRAVENOUS
  Filled 2023-02-18: qty 2

## 2023-02-18 NOTE — ED Triage Notes (Signed)
Recent dx of pericarditis  States for past hour been having burning sharp left sided chest pain

## 2023-02-18 NOTE — ED Notes (Signed)
 D/c paperwork reviewed with pt, including prescriptions and follow up care.  All questions and/or concerns addressed at time of d/c.  No further needs expressed. . Pt verbalized understanding, Ambulatory without assistance to ED exit, NAD.

## 2023-02-18 NOTE — ED Provider Notes (Signed)
Emergency Department Provider Note   I have reviewed the triage vital signs and the nursing notes.   HISTORY  Chief Complaint Chest Pain   HPI Cameron Caldwell is a 28 y.o. male with past history reviewed below presents emergency department with chest discomfort.  He reports return of sharp to slightly left-sided chest pain with an underlying burning sensation.  Symptoms have been ongoing for the past 1 to 2 hours.  He is not feeling particularly short of breath or having palpitations.  No syncope or near syncope symptoms.  He had a similar pain episode earlier this month and was seen in the emergency department, diagnosed with possible pericarditis, but did not take the naproxen.  No history of DVT or PE.  No fevers or chills.  No URI symptoms.  He did have some associated nausea with the pain symptoms today but no vomiting.   Past Medical History:  Diagnosis Date   Achondroplasia    Anxiety    Asthma    Asthma attack    Back pain     Review of Systems  Constitutional: No fever/chills Cardiovascular: Positive chest pain. Respiratory: Denies shortness of breath. Gastrointestinal: No abdominal pain.  No nausea, no vomiting.  Musculoskeletal: Negative for back pain. Skin: Negative for rash. Neurological: Negative for headaches.  ____________________________________________   PHYSICAL EXAM:  VITAL SIGNS: ED Triage Vitals  Encounter Vitals Group     BP 02/18/23 1019 115/78     Pulse Rate 02/18/23 1019 71     Resp 02/18/23 1019 18     Temp 02/18/23 1019 98.8 F (37.1 C)     Temp Source 02/18/23 1019 Oral     SpO2 02/18/23 1019 96 %     Weight 02/18/23 1014 136 lb 14.5 oz (62.1 kg)     Height 02/18/23 1014 4\' 9"  (1.448 m)   Constitutional: Alert and oriented. Well appearing and in no acute distress. Eyes: Conjunctivae are normal.  Head: Atraumatic. Nose: No congestion/rhinnorhea. Mouth/Throat: Mucous membranes are moist.   Neck: No stridor.   Cardiovascular:  Normal rate, regular rhythm. Good peripheral circulation. Grossly normal heart sounds.   Respiratory: Normal respiratory effort.  No retractions. Lungs CTAB. Gastrointestinal: Soft and nontender. No distention.  Musculoskeletal: No lower extremity tenderness nor edema. No gross deformities of extremities. Neurologic:  Normal speech and language. No gross focal neurologic deficits are appreciated.  Skin:  Skin is warm, dry and intact. No rash noted.  ____________________________________________   LABS (all labs ordered are listed, but only abnormal results are displayed)  Labs Reviewed  CBC - Abnormal; Notable for the following components:      Result Value   WBC 3.8 (*)    HCT 38.9 (*)    All other components within normal limits  BASIC METABOLIC PANEL  D-DIMER, QUANTITATIVE  TROPONIN I (HIGH SENSITIVITY)   ____________________________________________  EKG  NSR. Narrow QRS. Elevated J point diffusely with ST changes consistent with pericarditis. No STEMI.   ____________________________________________   PROCEDURES  Procedure(s) performed:   Procedures  None  ____________________________________________   INITIAL IMPRESSION / ASSESSMENT AND PLAN / ED COURSE  Pertinent labs & imaging results that were available during my care of the patient were reviewed by me and considered in my medical decision making (see chart for details).   This patient is Presenting for Evaluation of CP, which does require a range of treatment options, and is a complaint that involves a high risk of morbidity and mortality.  The  Differential Diagnoses includes but is not exclusive to acute coronary syndrome, aortic dissection, pulmonary embolism, cardiac tamponade, community-acquired pneumonia, pericarditis, musculoskeletal chest wall pain, etc.   Critical Interventions-    Medications  ondansetron (ZOFRAN) injection 4 mg (4 mg Intravenous Given 02/18/23 1110)  ketorolac (TORADOL) 30 MG/ML  injection 30 mg (30 mg Intravenous Given 02/18/23 1111)    Reassessment after intervention:  pain improved.   Clinical Laboratory Tests Ordered, included troponin and D dimer normal. No AKI. CBC without anemia.   Radiologic Tests Ordered, included CXR. I independently interpreted the images and agree with radiology interpretation.   Cardiac Monitor Tracing which shows NSR.    Social Determinants of Health Risk patient is a non-smoker.    Medical Decision Making: Summary:  Patient presents to the emergency department with fairly atypical chest pain which has returned over the past 2 hours.  Reassuring workup in the ED earlier this month with similar pain.  Did not take the NSAIDs prescribed for possible pericarditis.  Normal vitals.  Plan for D-dimer screening lab work, repeat troponin and chest x-ray.  Reevaluation with update and discussion with patient. Pain symptoms improved.   Patient's presentation is most consistent with acute presentation with potential threat to life or bodily function.   Disposition: discharge   ____________________________________________  FINAL CLINICAL IMPRESSION(S) / ED DIAGNOSES  Final diagnoses:  Precordial chest pain     NEW OUTPATIENT MEDICATIONS STARTED DURING THIS VISIT:  Discharge Medication List as of 02/18/2023 12:40 PM     START taking these medications   Details  pantoprazole (PROTONIX) 40 MG tablet Take 1 tablet (40 mg total) by mouth daily., Starting Fri 02/18/2023, Normal        Note:  This document was prepared using Dragon voice recognition software and may include unintentional dictation errors.  Alona Bene, MD, [facility]    Wynter Grave, Arlyss Repress, MD 02/20/23 [phone]

## 2023-02-18 NOTE — Discharge Instructions (Signed)
You were seen in the emerged from today with chest pain.  I do think it would be wise to start the naproxen.  You can take 500 mg every 12 hours for the next week and reassess your pain symptoms.  I have also called in a medication to help with acid reduction in your stomach.  Please follow closely with your primary care doctor.  If you develop any new or suddenly worsening chest pain, trouble breathing, other sudden/severe symptoms please return to the emergency department for reevaluation.

## 2023-03-04 ENCOUNTER — Encounter (HOSPITAL_BASED_OUTPATIENT_CLINIC_OR_DEPARTMENT_OTHER): Payer: Self-pay

## 2023-03-04 ENCOUNTER — Other Ambulatory Visit: Payer: Self-pay

## 2023-03-04 ENCOUNTER — Emergency Department (HOSPITAL_BASED_OUTPATIENT_CLINIC_OR_DEPARTMENT_OTHER)
Admission: EM | Admit: 2023-03-04 | Discharge: 2023-03-04 | Disposition: A | Discharge status: Home / Self Care | Payer: No Typology Code available for payment source

## 2023-03-04 DIAGNOSIS — R5381 Other malaise: Secondary | ICD-10-CM | POA: Diagnosis present

## 2023-03-04 DIAGNOSIS — B349 Viral infection, unspecified: Secondary | ICD-10-CM | POA: Diagnosis not present

## 2023-03-04 DIAGNOSIS — R197 Diarrhea, unspecified: Secondary | ICD-10-CM | POA: Insufficient documentation

## 2023-03-04 DIAGNOSIS — Z20822 Contact with and (suspected) exposure to covid-19: Secondary | ICD-10-CM | POA: Insufficient documentation

## 2023-03-04 DIAGNOSIS — R109 Unspecified abdominal pain: Secondary | ICD-10-CM | POA: Insufficient documentation

## 2023-03-04 LAB — RESP PANEL BY RT-PCR (RSV, FLU A&B, COVID)  RVPGX2
Influenza A by PCR: NEGATIVE
Influenza B by PCR: NEGATIVE
Resp Syncytial Virus by PCR: NEGATIVE
SARS Coronavirus 2 by RT PCR: NEGATIVE

## 2023-03-04 MED ORDER — ONDANSETRON 4 MG PO TBDP
4.0000 mg | ORAL_TABLET | Freq: Once | ORAL | Status: AC
  Administered 2023-03-04: 4 mg via ORAL
  Filled 2023-03-04: qty 1

## 2023-03-04 MED ORDER — ONDANSETRON 4 MG PO TBDP: 4.0000 mg | ORAL_TABLET | Freq: Three times a day (TID) | ORAL | 0 refills | Status: AC | PRN

## 2023-03-04 NOTE — ED Notes (Signed)
Pt. Reports he started with a headache 2 days ago and now he reports having diarrhea for x 4 episodes and nausea.  Pt. Is in no distress.

## 2023-03-04 NOTE — ED Provider Notes (Signed)
[place] [facility] Provider Note   CSN: [id number] Arrival date & time: 03/04/23  1053     History  Chief Complaint  Patient presents with   Abdominal Pain    Cameron Caldwell is a 28 y.o. male.  28 year old male presenting emergency department for viral syndrome complaints.  Reports generalized malaise for the past several days with crampy intermittent abdominal pain with diarrhea.  He is nauseated, but no vomiting.  Reports having generalized headache gradual onset as well.  Reports history of headaches.  No neck stiffness or photophobia.   Abdominal Pain      Home Medications Prior to Admission medications   Medication Sig Start Date End Date Taking? Authorizing Provider  albuterol (PROVENTIL) (2.5 MG/3ML) 0.083% nebulizer solution Take 3 mLs (2.5 mg total) by nebulization every 6 (six) hours as needed for wheezing or shortness of breath. 05/23/20   Gilda Crease, MD  albuterol (VENTOLIN HFA) 108 (90 Base) MCG/ACT inhaler Inhale 2 puffs into the lungs every 4 (four) hours as needed for wheezing or shortness of breath. 05/23/20   Gilda Crease, MD  albuterol (VENTOLIN HFA) 108 (90 Base) MCG/ACT inhaler Inhale 1-2 puffs into the lungs every 6 (six) hours as needed for wheezing or shortness of breath. 05/13/21   Palumbo, April, MD  fluticasone (FLONASE) 50 MCG/ACT nasal spray Place 1 spray into both nostrils daily. 08/13/19   Petrucelli, Samantha R, PA-C  hydrOXYzine (ATARAX/VISTARIL) 25 MG tablet Take 1 tablet (25 mg total) by mouth every 8 (eight) hours as needed for anxiety. 06/10/20   Lorelee New, PA-C  naproxen (NAPROSYN) 500 MG tablet TK 1 T PO BID 03/27/18   [provider]  omeprazole (PRILOSEC) 20 MG capsule Take 1 capsule (20 mg total) by mouth daily. 05/13/21   Palumbo, April, MD  omeprazole (PRILOSEC) 20 MG capsule Take 1 capsule (20 mg total) by mouth daily. 05/13/21   Palumbo, April, MD   oxyCODONE-acetaminophen (PERCOCET/ROXICET) 5-325 MG tablet TK 1 T PO D 03/27/18   [provider]  pantoprazole (PROTONIX) 40 MG tablet Take 1 tablet (40 mg total) by mouth daily. 02/18/23   Long, Arlyss Repress, MD  predniSONE (DELTASONE) 20 MG tablet Take 2 tablets (40 mg total) by mouth daily with breakfast. 05/19/22   Blue, Soijett A, PA-C  QVAR REDIHALER 40 MCG/ACT inhaler INHALE 1 PUFF PO BID 10/28/17   [provider]      Allergies    Cat dander, Amoxicillin, and Bee pollen    Review of Systems   Review of Systems  Gastrointestinal:  Positive for abdominal pain.    Physical Exam Updated Vital Signs BP 136/86 (BP Location: Right Arm)   Pulse 71   Temp 98.3 F (36.8 C) (Oral)   Resp 16   Ht 4\' 8"  (1.422 m)   Wt 63.5 kg   SpO2 99%   BMI 31.39 kg/m  Physical Exam Vitals and nursing note reviewed.  Constitutional:      General: He is not in acute distress.    Appearance: He is not toxic-appearing.  HENT:     Head: Normocephalic and atraumatic.     Nose: Nose normal.     Mouth/Throat:     Mouth: Mucous membranes are moist.  Eyes:     Conjunctiva/sclera: Conjunctivae normal.  Cardiovascular:     Rate and Rhythm: Normal rate and regular rhythm.  Pulmonary:     Effort: Pulmonary effort is normal.  Breath sounds: Normal breath sounds.  Abdominal:     General: Abdomen is flat. There is no distension.     Palpations: Abdomen is soft.     Tenderness: There is no abdominal tenderness. There is no guarding or rebound.     Hernia: No hernia is present.  Musculoskeletal:        General: Normal range of motion.  Skin:    General: Skin is warm.     Capillary Refill: Capillary refill takes less than 2 seconds.  Neurological:     Mental Status: He is alert and oriented to person, place, and time.  Psychiatric:        Mood and Affect: Mood normal.        Behavior: Behavior normal.     ED Results / Procedures / Treatments   Labs (all labs ordered are  listed, but only abnormal results are displayed) Labs Reviewed  RESP PANEL BY RT-PCR (RSV, FLU A&B, COVID)  [id number]    EKG None  Radiology No results found.  Procedures Procedures    Medications Ordered in ED Medications - No data to display  ED Course/ Medical Decision Making/ A&P                                 Medical Decision Making This this is a 28 year old male presenting the emergency department with viral syndrome type complaints.  He is afebrile nontachycardic hemodynamically stable.  Normal neurologic exam.  Nontender abdomen.  Symptoms consistent with viral etiology given the prevalence of flu COVID, swab for the same.  However, he is outside of the window for Tamiflu or Paxlovid if he does have either.  Discussed in detail supportive care.  Clinically well-appearing without evidence of dehydration.  Low suspicion for systemic infection or metabolic derangements.  Will forego lab at this time.  Lungs are clear; low suspicion for pneumonia.  Will forego x-ray.  Benign abdominal exam with no focal tenderness, no need for CT scan as I suspect patient's symptoms secondary to virus.  Given Zofran here.  Stable for discharge at this time.         Final Clinical Impression(s) / ED Diagnoses Final diagnoses:  None    Rx / DC Orders ED Discharge Orders     None         Coral Spikes, DO 03/04/23 1229

## 2023-03-04 NOTE — Discharge Instructions (Addendum)
As discussed you can follow-up the results of your flu and COVID test on MyChart.  Take over-the-counter Tylenol alternating with ibuprofen with dosages as directed on the packaging for headache, fevers and generalized pain.  We are prescribing you Zofran for nausea.  You may take it as needed.  Please try to maintain adequate hydration with frequent sips of clear liquids or low sugar Gatorade.  Return immediately if you develop persistent fevers, worsening abdominal pain inability to eat or drink due to nausea and vomiting, or if you develop chest pain, shortness of breath or you develop any new or worsening symptoms that are concerning to you.

## 2023-03-04 NOTE — ED Triage Notes (Signed)
In for eval of abd pain, diarrhea, nausea, left ear ache, and headache onset yesterday. Denies vomiting. Denies cough, chills, fever, or sore throat.

## 2023-03-23 IMAGING — DX DG CHEST 2V
2 series · 2 of 2 positions shown · non-contrast
Comparison: 05/13/2021

CLINICAL DATA: Chest pain

EXAM:
CHEST - 2 VIEW

[chest pa]
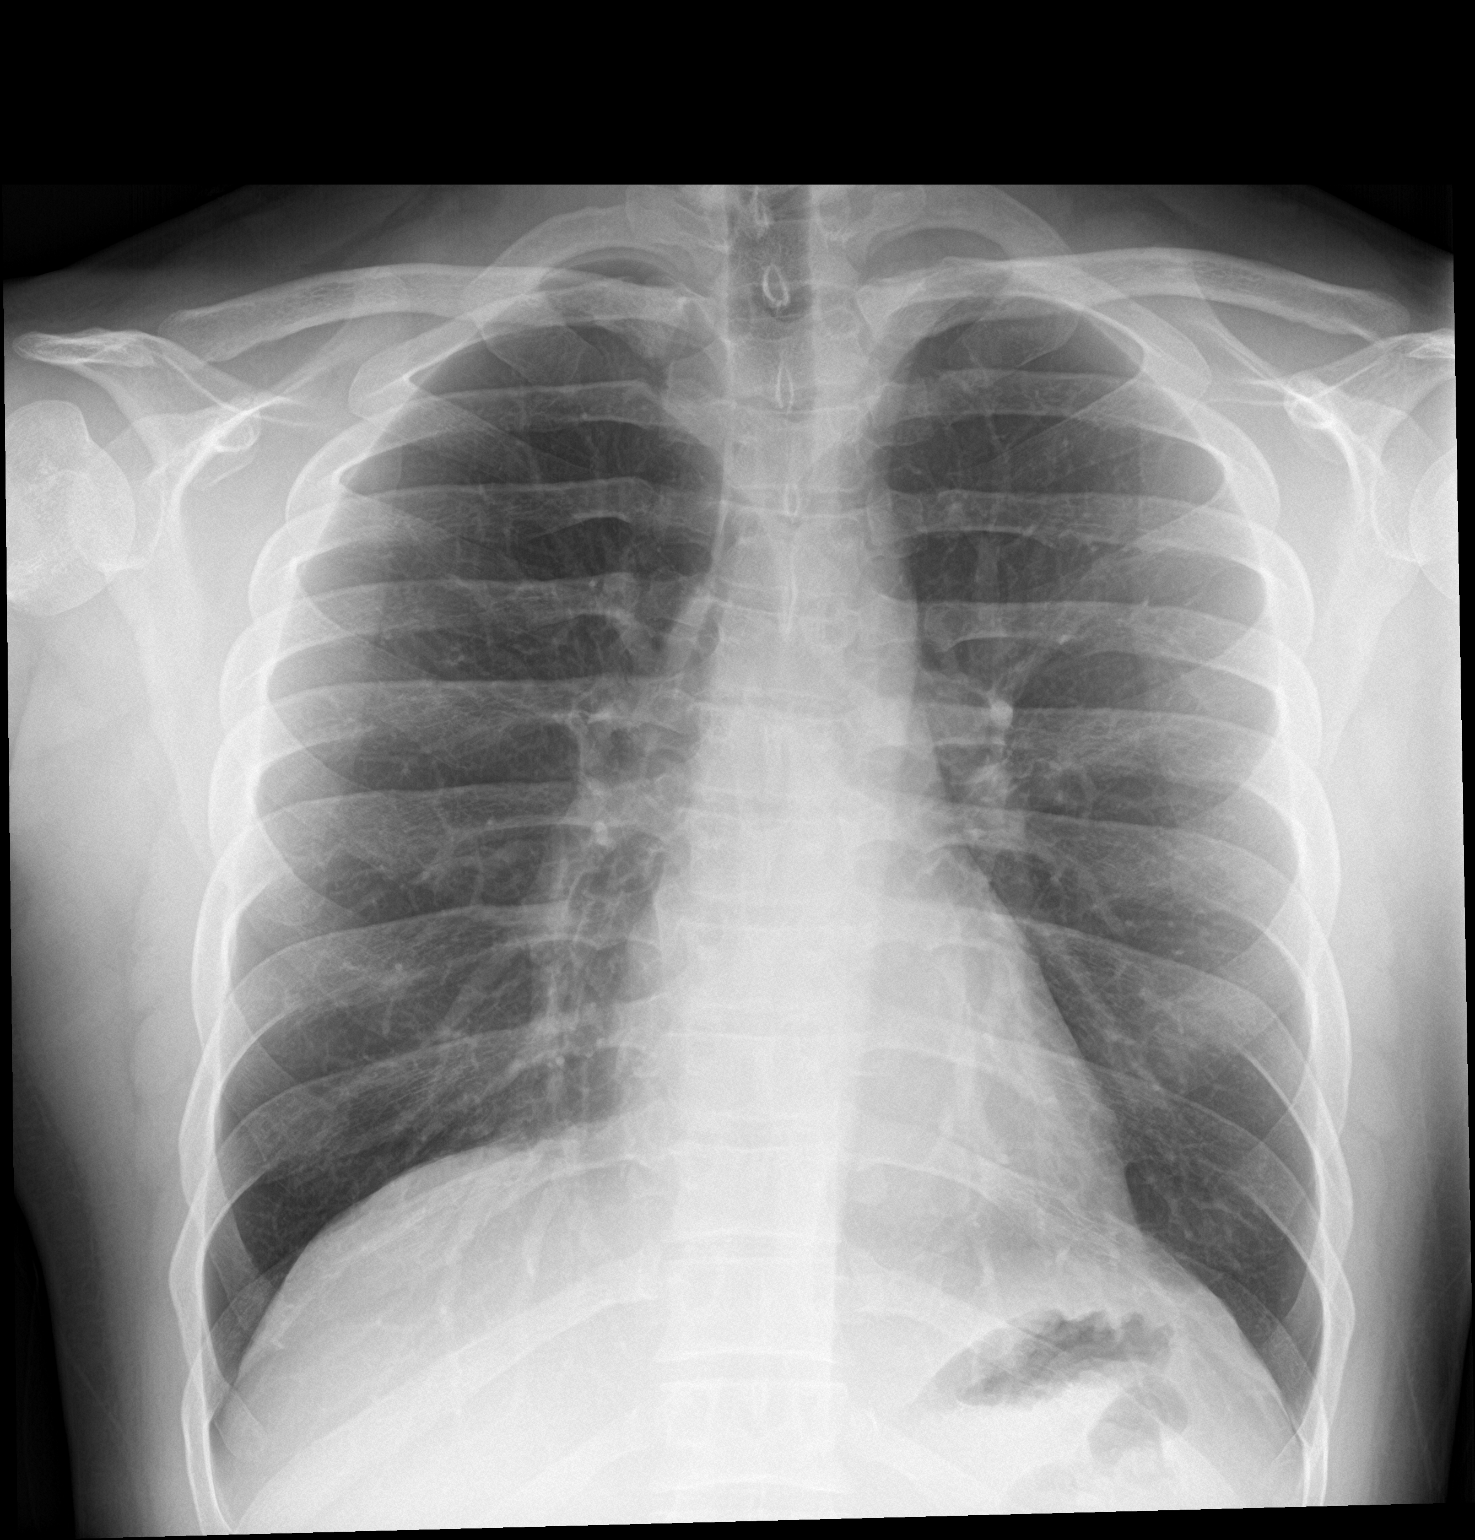

[chest lat]
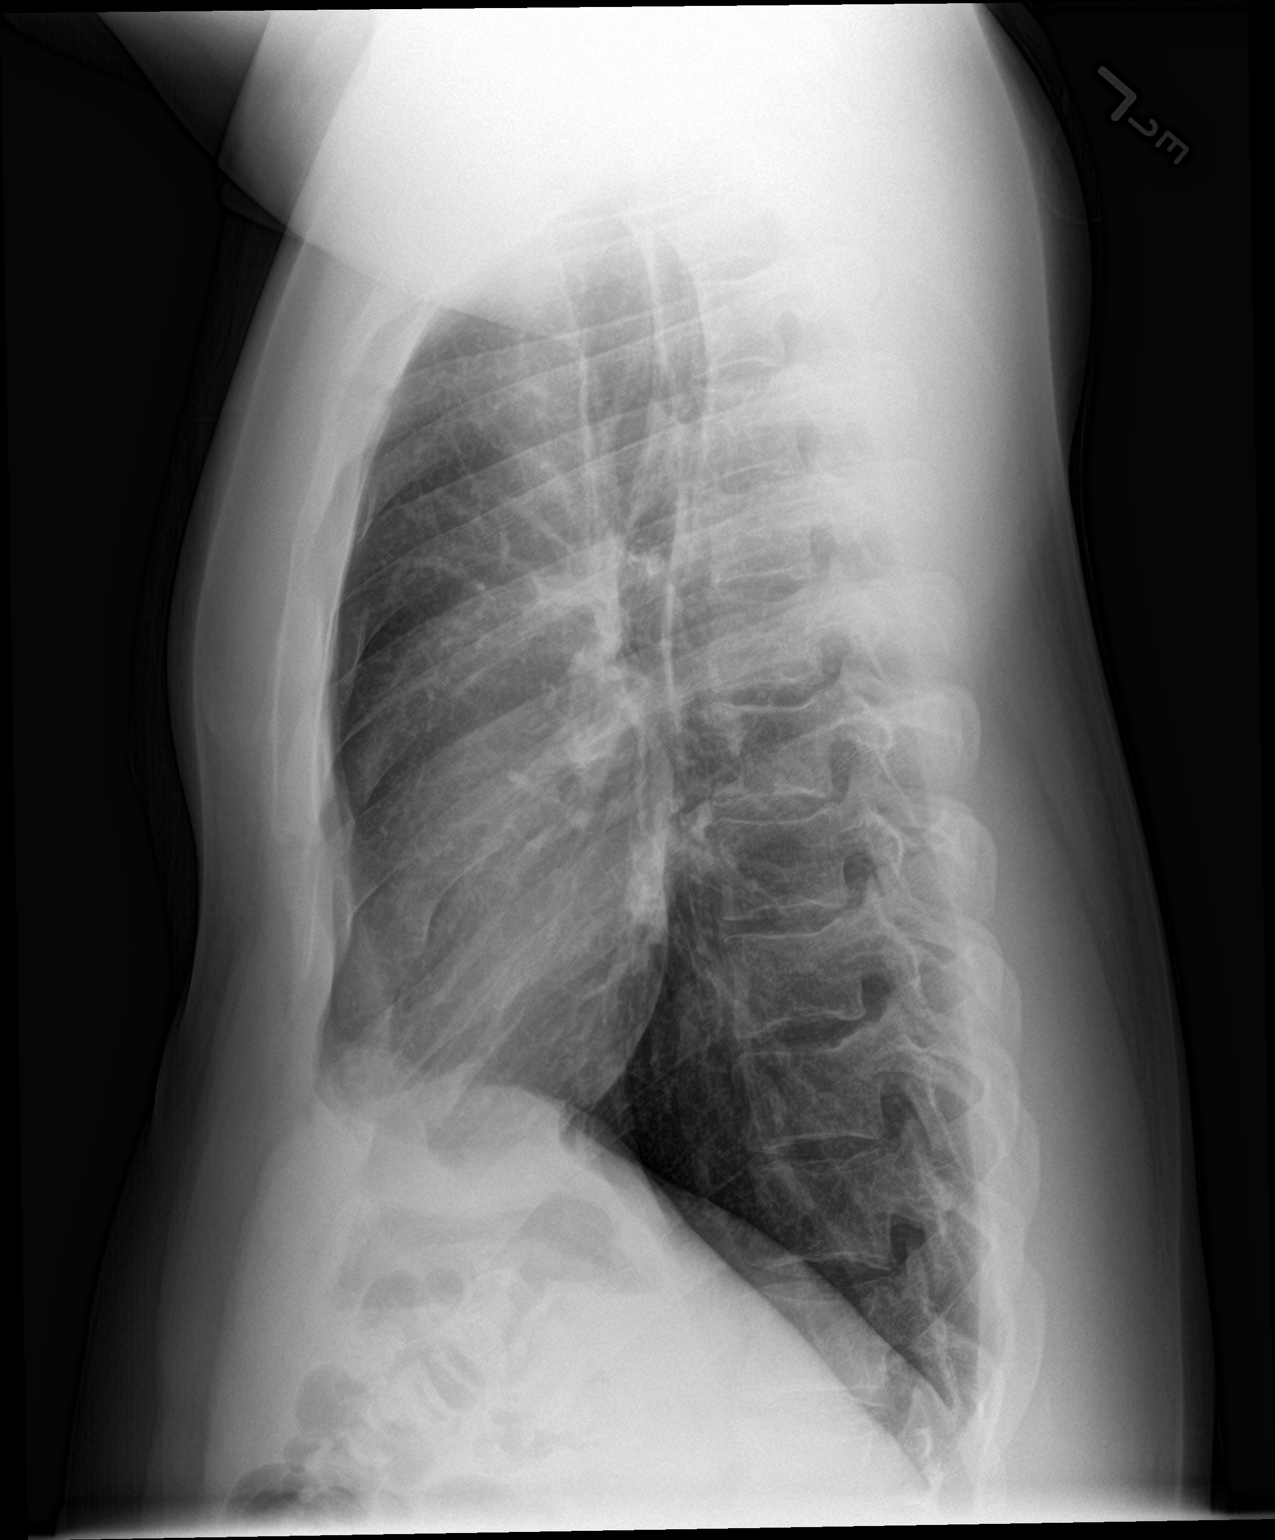

[2 of 2 positions shown; findings below may reference images not displayed]

FINDINGS: The heart size and mediastinal contours are within normal limits.
Both lungs are clear. The visualized skeletal structures are
unremarkable.
IMPRESSION: No active cardiopulmonary disease.

## 2023-05-23 ENCOUNTER — Emergency Department (HOSPITAL_BASED_OUTPATIENT_CLINIC_OR_DEPARTMENT_OTHER): Payer: Self-pay

## 2023-05-23 ENCOUNTER — Other Ambulatory Visit: Payer: Self-pay

## 2023-05-23 ENCOUNTER — Emergency Department (HOSPITAL_BASED_OUTPATIENT_CLINIC_OR_DEPARTMENT_OTHER)
Admission: EM | Admit: 2023-05-23 | Discharge: 2023-05-23 | Disposition: A | Discharge status: Home / Self Care | Payer: Self-pay | Attending: Emergency Medicine | Admitting: Emergency Medicine

## 2023-05-23 ENCOUNTER — Encounter (HOSPITAL_BASED_OUTPATIENT_CLINIC_OR_DEPARTMENT_OTHER): Payer: Self-pay

## 2023-05-23 DIAGNOSIS — Z7951 Long term (current) use of inhaled steroids: Secondary | ICD-10-CM | POA: Insufficient documentation

## 2023-05-23 DIAGNOSIS — J45909 Unspecified asthma, uncomplicated: Secondary | ICD-10-CM | POA: Diagnosis not present

## 2023-05-23 DIAGNOSIS — R0602 Shortness of breath: Secondary | ICD-10-CM | POA: Diagnosis present

## 2023-05-23 MED ORDER — ALBUTEROL SULFATE HFA 108 (90 BASE) MCG/ACT IN AERS
INHALATION_SPRAY | RESPIRATORY_TRACT | Status: AC
  Filled 2023-05-23: qty 6.7

## 2023-05-23 MED ORDER — ALBUTEROL SULFATE HFA 108 (90 BASE) MCG/ACT IN AERS
8.0000 | INHALATION_SPRAY | Freq: Once | RESPIRATORY_TRACT | Status: AC
  Administered 2023-05-23: 8 via RESPIRATORY_TRACT

## 2023-05-23 MED ORDER — AEROCHAMBER PLUS FLO-VU MEDIUM MISC
1.0000 | Freq: Once | Status: AC
  Administered 2023-05-23: 1
  Filled 2023-05-23: qty 1

## 2023-05-23 MED ORDER — DEXAMETHASONE 4 MG PO TABS
10.0000 mg | ORAL_TABLET | Freq: Once | ORAL | Status: AC
  Administered 2023-05-23: 10 mg via ORAL
  Filled 2023-05-23: qty 3

## 2023-05-23 NOTE — ED Triage Notes (Signed)
 C/o shortness of breath for the past few days, worse today. Used inhaler 30 mins pta. Hx of asthma. States has seasonal allergies. RT in triage to assess. Denies chest pain, states some tightness

## 2023-05-23 NOTE — Discharge Instructions (Addendum)
 You were seen today for shortness of breath and evaluated for an asthma exacerbation. At this time, it seems like your symptoms have improved with albuterol  and oral steroid to help with inflammation. You have been doing well on room air. We recommend that you make an appointment with your PCP, Cameron Caldwell, to discuss improved asthma and seasonal allergy symptom control as needed.   If you experience any sudden onset of shortness of breath, including difficulty talking due to shortness of breath, or your symptoms are not responsive to the albuterol  inhaler, please go to an urgent care or emergency department as you may be experiencing a medical emergency.

## 2023-05-23 NOTE — ED Provider Notes (Signed)
 [facility] Provider Note   CSN: [id number] Arrival date & time: 05/23/23  1124     History  Chief Complaint  Patient presents with   Shortness of Breath    [place] J Malecha is a 28 y.o. male.  [place] Orton is a 55 M with a past medical history of achondroplasia and asthma who presents for shortness of breath. Started to feel worsening in shortness of breath a few weeks ago, feels like pollen/seasonal allergies tend to exacerbate his asthma symptoms. Endorses itchy eyes and some wheezing. Denies any fever, nausea, vomiting, cough (daytime/nighttime), recent illness, or sick contacts. Does not have a pulse ox at home but feels like he is not able to be as active before he starts to feel short of breath. Able to have a conversation with no issue. Uses an albuterol  inhaler at home and this has been helpful but he ran out of his inhaler yesterday. Has also tried taking OTC claritin but has experienced minimal symptom relief. Follows with [facility] for primary care.    Shortness of Breath Associated symptoms: no fever       Home Medications Prior to Admission medications   Medication Sig Start Date End Date Taking? Authorizing Provider  albuterol  (PROVENTIL ) (2.5 MG/3ML) 0.083% nebulizer solution Take 3 mLs (2.5 mg total) by nebulization every 6 (six) hours as needed for wheezing or shortness of breath. 05/23/20   Ballard Bongo, MD  albuterol  (VENTOLIN  HFA) 108 (90 Base) MCG/ACT inhaler Inhale 2 puffs into the lungs every 4 (four) hours as needed for wheezing or shortness of breath. 05/23/20   Pollina, Christopher J, MD  albuterol  (VENTOLIN  HFA) 108 (90 Base) MCG/ACT inhaler Inhale 1-2 puffs into the lungs every 6 (six) hours as needed for wheezing or shortness of breath. 05/13/21   Palumbo, April, MD  fluticasone  (FLONASE ) 50 MCG/ACT nasal spray Place 1 spray into both nostrils daily. 08/13/19   Petrucelli, Samantha R, PA-C  hydrOXYzine   (ATARAX /VISTARIL ) 25 MG tablet Take 1 tablet (25 mg total) by mouth every 8 (eight) hours as needed for anxiety. 06/10/20   Jhonnie Mosher, PA-C  naproxen (NAPROSYN) 500 MG tablet TK 1 T PO BID 03/27/18   [provider]  omeprazole  (PRILOSEC) 20 MG capsule Take 1 capsule (20 mg total) by mouth daily. 05/13/21   Palumbo, April, MD  omeprazole  (PRILOSEC) 20 MG capsule Take 1 capsule (20 mg total) by mouth daily. 05/13/21   Palumbo, April, MD  ondansetron  (ZOFRAN -ODT) 4 MG disintegrating tablet Take 1 tablet (4 mg total) by mouth every 8 (eight) hours as needed for nausea or vomiting. 03/04/23   Rolinda Climes, DO  oxyCODONE-acetaminophen  (PERCOCET/ROXICET) 5-325 MG tablet TK 1 T PO D 03/27/18   [provider]  pantoprazole  (PROTONIX ) 40 MG tablet Take 1 tablet (40 mg total) by mouth daily. 02/18/23   Long, Joshua G, MD  predniSONE  (DELTASONE ) 20 MG tablet Take 2 tablets (40 mg total) by mouth daily with breakfast. 05/19/22   Blue, Soijett A, PA-C  QVAR REDIHALER 40 MCG/ACT inhaler INHALE 1 PUFF PO BID 10/28/17   [provider]      Allergies    Cat dander, Amoxicillin, and Bee pollen    Review of Systems   Review of Systems  Constitutional:  Negative for fever.  Respiratory:  Positive for shortness of breath.     Physical Exam Updated Vital Signs BP 124/80 (BP Location: Left Arm)   Pulse 95  Temp 98.2 F (36.8 C) (Oral)   Resp 20   Ht 4\' 8"  (1.422 m)   Wt 64.9 kg   SpO2 99%   BMI 32.06 kg/m  Physical Exam HENT:     Head: Normocephalic and atraumatic.  Cardiovascular:     Rate and Rhythm: Normal rate and regular rhythm.  Pulmonary:     Effort: Pulmonary effort is normal.     Comments: Minimal wheezing upper airway, otherwise clear lungs and normal airflow throughout both lungs. Abdominal:     Palpations: Abdomen is soft.  Skin:    General: Skin is warm.  Neurological:     General: No focal deficit present.     Mental Status: He is alert.   Psychiatric:        Mood and Affect: Mood normal.     ED Results / Procedures / Treatments   Labs (all labs ordered are listed, but only abnormal results are displayed) Labs Reviewed - No data to display  EKG None  Radiology DG Chest 2 View Result Date: 05/23/2023 CLINICAL DATA:  Shortness of breath EXAM: CHEST - 2 VIEW COMPARISON:  February 18, 2023 FINDINGS: The heart size and mediastinal contours are within normal limits. Both lungs are clear. The visualized skeletal structures are unremarkable. IMPRESSION: No active cardiopulmonary disease. Electronically Signed   By: Fredrich Jefferson M.D.   On: 05/23/2023 12:30    Procedures Procedures    Medications Ordered in ED Medications  albuterol  (VENTOLIN  HFA) 108 (90 Base) MCG/ACT inhaler (  Not Given 05/23/23 1243)  albuterol  (VENTOLIN  HFA) 108 (90 Base) MCG/ACT inhaler 8 puff (8 puffs Inhalation Given 05/23/23 1239)  AeroChamber Plus Flo-Vu Medium MISC 1 each (1 each Other Given 05/23/23 1239)  dexamethasone  (DECADRON ) tablet 10 mg (10 mg Oral Given 05/23/23 1248)    ED Course/ Medical Decision Making/ A&P                                 Medical Decision Making Patient is not in any acute distress. Vitals are stable and he is saturating 99% on room air. He is able to carry on a conversation without stopping to catch his breath. Pulmonary exam only notable for minimal wheezing. CV exam unremarkable. Patient intermittently sniffing, no cough/sneezing observed. Based on history of present illness and clinical presentation patient's baseline asthma worsened in the setting of seasonal allergies and lack of PRN albuterol . Discussed low suspicion for infection such as URI or pneumonia. Patient was given 8 puffs of albuterol  inhaler with spacer and PO decadron  10 mg with symptom improvement. Will take albuterol  inhaler home and will follow up with PCP for further management of seasonal allergies and asthma. Patient acknowledged understanding and is  comfortable with discharge home. Return precautions provided.   Amount and/or Complexity of Data Reviewed Radiology: ordered.  Risk Prescription drug management.          Final Clinical Impression(s) / ED Diagnoses Final diagnoses:  Shortness of breath    Rx / DC Orders ED Discharge Orders     None         Arellano Zameza, Catheryne Deford, MD 05/23/23 1356

## 2023-05-23 NOTE — ED Notes (Signed)
 RT verified with MD on 8 puffs of albuterol  for patient before administering.

## 2023-05-25 NOTE — ED Provider Notes (Signed)
 I have personally seen and examined the patient.  I have discussed the plan of care with the resident.  I have reviewed the documentation on PMH/FH/Soc. History.  I have reviewed the documentation of the resident and agree.  28 year old male with a history of asthma comes in with a chief complaint of an asthma exacerbation.  States it feels similar to his exacerbations in the past.  Used his inhaler just prior to arrival here.  On my exam the patient has clear lung sounds.  No prolonged expiratory effort no wheezes.  Chest x-ray independently interpreted by me without focal infiltrate or pneumothorax.  He was given 8 puffs albuterol  and Decadron .  Discharged home.  PCP follow-up.   EKG Interpretation Date/Time:    Ventricular Rate:    PR Interval:    QRS Duration:    QT Interval:    QTC Calculation:   R Axis:      Text Interpretation:            Albertus Hughs, DO 05/25/23 9811

## 2023-07-28 NOTE — ED Provider Notes (Signed)
 ------------------------------------------------------------------------------- Attestation signed by Therisa KANDICE Silvan, MD at 07/28/23 1940 I attest that I am the attending provider for this patient and have reviewed the medical decision-making and plan as part of ongoing quality measures.  I was physically present in the department and available for consultation.  This case was not discussed with me during the patient's time in the ED.  -------------------------------------------------------------------------------   [facility]  ED Provider Note  Cameron Caldwell 28 y.o. male DOB: [birth year] MRN: [id number] History   Chief Complaint  Patient presents with   Cough    C/o cough , congestion x 17 days   28 year old male coming to the emergency department complaining of cough and congestion for approximately 3 days denies any sick exposures states that he has been using his inhaler with minimal relief states that his cough is productive with brown and yellow sputum denies any fever or chills.  States that he has not been able to do any breathing treatments as he has run out of his nebulizer cartridges       History reviewed. No pertinent past medical history.  History reviewed. No pertinent surgical history.  Social History   Substance and Sexual Activity  Alcohol Use None   Tobacco Use History[1] E-Cigarettes   Vaping Use     Start Date     Cartridges/Day     Quit Date     Social History   Substance and Sexual Activity  Drug Use Not on file         Allergies[2]  Discharge Medication List as of 07/28/2023 10:16 AM      Primary Survey  Primary Survey  Review of Systems   Review of Systems  Constitutional:  Negative for chills and fever.  HENT:  Positive for congestion. Negative for ear pain and sore throat.   Eyes:  Negative for pain and visual disturbance.  Respiratory:  Positive for cough and shortness of breath. Negative  for wheezing.   Cardiovascular:  Negative for chest pain and palpitations.  Gastrointestinal:  Negative for abdominal pain and vomiting.  Genitourinary:  Negative for dysuria and hematuria.  Musculoskeletal:  Negative for arthralgias and back pain.  Skin:  Negative for color change and rash.  Neurological:  Negative for seizures and syncope.  All other systems reviewed and are negative.   Physical Exam   ED Triage Vitals [07/28/23 0905]  BP (!) 135/91  Heart Rate 95  Resp 16  SpO2 99 %  Temp 98.1 F (36.7 C)    Physical Exam  Nursing note and vitals reviewed. Constitutional: He appears well-developed and well-nourished. He no respiratory distress.  HENT:  Head: Normocephalic and atraumatic.  Eyes: EOM are intact. Pupils are equal, round, and reactive to light.  Neck: Normal range of motion. Neck supple.  Cardiovascular: Normal rate, regular rhythm and normal heart sounds.  Pulmonary/Chest: No respiratory distress. Good air movement. Not tachypneic. Respiratory effort normal and breath sounds normal.  Abdominal: Soft. Bowel sounds are normal.  Musculoskeletal: Normal range of motion.     Cervical back: Normal range of motion and neck supple.   Neurological: He is alert. He has normal speech.  Skin: Skin is warm. Skin is dry.  Psychiatric: He has a normal mood and affect. His behavior is normal.     ED Course   Lab results: No data to display  Imaging:   XR CHEST AP PORTABLE   Narrative:    INDICATION: Cough COMPARISON: None available TECHNIQUE:  Single AP view of the chest was performed. 07/28/2023 9:40 AM   FINDINGS:  SUPPORT APPARATUS: N.A.   LUNGS/PLEURA: No pneumothorax. No focal infiltrate or edema.  HEART/MEDIASTINUM: Unremarkable cardiomediastinal silhouette.   MISC: N.A.     Impression:    IMPRESSION: No acute findings.    Electronically Signed by: Belvie Bath, MD on 07/28/2023 9:51 AM      ECG: ECG Results          ECG  12-Lead (In process)  Result time 07/28/23 10:20:46    In process             Narrative:   Diagnosis Class Abnormal Acquisition Device D3K Ventricular Rate 91 Atrial Rate 91 P-R Interval 158 QRS Duration 94 Q-T Interval 322 QTC Calculation(Bazett) 396 Calculated P Axis 73 Calculated R Axis 86 Calculated T Axis 47  Diagnosis Normal sinus rhythm ST elevation, consider early repolarization, pericarditis, or injury Abnormal ECG No previous ECGs available                                                                                       Pre-Sedation Procedures    Medical Decision Making Patient's vital signs are stable shows no signs of any respiratory distress.  Patient's chest x-ray was unremarkable which was reassured the patient  Lung auscultation was unremarkable with no signs of any wheezing encourage patient to continue with conservative treatment using Tylenol  and Motrin  for pain patient was given a refill on his nebulizer cartridges as well as medication for cough.  Discussed with patient on refraining from using any steroids unless shortness of breath and wheezing worsens.  Strict return precautions given  My exam findings, treatment plan has been discused with the patient/ family. Patient/ family understand and agree with the findings, diagnosis, and treatment plan. will discharge at this time.  Please see the 'After Visit Summary' discharge instructions for more in-depth and detailed discharge plan.  Risk OTC drugs. Prescription drug management.           Provider Communication  Discharge Medication List as of 07/28/2023 10:16 AM     START taking these medications   Details  albuterol  (ACCUNEB ) 1.25 MG/3ML nebulizer solution Take 3 mLs (1.25 mg dose) by nebulization every 6 (six) hours as needed for Wheezing., Starting Thu 07/28/2023, Normal    dextromethorphan 15 MG/5ML syrup Take 10 mLs (30 mg  dose) by mouth 4 (four) times a day as needed for Cough for up to 10 days., Starting Thu 07/28/2023, Until Sun 08/07/2023 at 2359, Normal    predniSONE  (DELTASONE ) 10 mg tablet 4 tabs daily x2 days, 3 tabs daily x2 days, 2 tabs daily x2 days, 1 tablet daily x2 days, Normal        Discharge Medication List as of 07/28/2023 10:16 AM      Discharge Medication List as of 07/28/2023 10:16 AM      Clinical Impression Final diagnoses:  Viral URI with cough    ED Disposition     ED Disposition  Discharge   Condition  Stable   Comment  --  Electronically signed by:      [1] Social History Tobacco Use  Smoking Status Not on file  Smokeless Tobacco Not on file  [2] Allergies Allergen Reactions   Amoxil [Amoxicillin] Abdominal Pain   Delon Ash, PA-C 07/28/23 1322

## 2023-12-14 NOTE — ED Provider Notes (Signed)
 " [facility]  ED Provider Note  Cameron Caldwell 28 y.o. male DOB: 15-Mar-1995 MRN: [id number] History   Chief Complaint  Patient presents with   Abdominal Pain    C/o epigastric abd pain x 2 days with diarrhea, nasal congestion    Cameron Caldwell has a past medical history of achondroplasia.  Cameron Caldwell presents to the ED with right upper quadrant abdominal pain that started yesterday.  Cameron Caldwell has also had associated nasal congestion and diarrhea.  Cameron Caldwell has 3 young children, and Cameron Caldwell has been exposed to viral illnesses through his children.  Cameron Caldwell denies fever.  No nausea or vomiting.  Pain does not worsen with deep breathing or eating.   History provided by:  Patient Abdominal Pain Associated symptoms: no cough        Past Medical History[1]  Past Surgical History[2]  Social History   Substance and Sexual Activity  Alcohol Use None   Tobacco Use History[3] E-Cigarettes   Vaping Use     Start Date     Cartridges/Day     Quit Date     Social History   Substance and Sexual Activity  Drug Use Not on file         Allergies[4]  Home Medications   ALBUTEROL  (ACCUNEB ) 1.25 MG/3ML NEBULIZER SOLUTION    Take 3 mLs (1.25 mg dose) by nebulization every 6 (six) hours as needed for Wheezing.    Primary Survey  Primary Survey  Review of Systems   Review of Systems  Respiratory:  Negative for cough.   Gastrointestinal:  Positive for abdominal pain.    Physical Exam   ED Triage Vitals [12/14/23 0651]  BP 130/84  Heart Rate 91  Resp 16  SpO2 97 %  Temp 97.8 F (36.6 C)    Physical Exam  Nursing note and vitals reviewed. Constitutional: Cameron Caldwell appears well-developed and well-nourished.  HENT:  Head: Normocephalic and atraumatic.  Mouth/Throat: Voice normal.  Eyes: Conjunctivae are normal. Right eye: no drainage. no conjunctival injection. Left eye: no drainage. no conjunctival injection.  Neck: Voice normal. Neck supple.   Cardiovascular: Normal rate, regular rhythm and normal heart sounds.  Pulmonary/Chest: Respiratory effort normal and breath sounds normal.  Abdominal: Soft. There is mild abdominal tenderness in the right upper quadrant. Abdomen not distended.  Musculoskeletal: No obvious deformity noted to extremities.     Cervical back: Neck supple. no edema.  Neurological: Cameron Caldwell is alert and oriented to person, place, and time. Moves all extremities equally.  Skin: Skin is warm. Skin is dry.  Psychiatric: Cameron Caldwell has a normal mood and affect. His behavior is normal.     ED Course   Lab results:   CBC AND DIFFERENTIAL - Abnormal      Result Value   WBC 3.0 (*)    RBC 5.18     HGB 15.0     HCT 44.7     MCV 86.3     MCH 29.0     MCHC 33.6     Plt Ct 188     RDW SD 37.4     MPV 9.4     NRBC% 0.0     Absolute NRBC Count 0.00    MANUAL DIFFERENTIAL - Abnormal   Segmented Neutrophil % Man 34     Lymphocyte % Manual 47     Monocyte % Manual 9     Eosinophil % Manual 8     Band Neutrophil % Manual 1  Metamyelocytes % Manual  1     Absolute Neutrophil Count, Manual 1.05 (*)    Absolute Lymphocyte Count, Manual 1.41     Absolute Monocyte Count, Manual 0.27     Absolute Eosinophil Count, Manual 0.24     PLATELET EST Adequate     RBC MORPH Normal     Absolute Band Neutrophil Count Manual 0.03     Absolute Metamyelocytes Count, Manual 0.03 (*)   COVID-19, FLU A+B AND RSV - Normal   Flu A Negative     Flu B Negative     RSV PCR Negative     SARS-COV-2 Not Detected     Narrative:    SARS-COV-2 (COVID-19)PCR-Negative results do not preclude SARS-CoV-2 infection and should not be used as the sole basis for patient management decisions. Negative results must be combined with clinical observations, patient history, and epidemiological information.  Flu and/or RSV - Negative results do not preclude the presence of Flu or RSV virus and should not be used as the sole basis for treatment or other patient  management decisions. False negative results may occur if virus is present at levels below the analytical limit of detection.  This test detects Influenza A, Influenza B, and Respiratory Syncytial Virus and SARS-COV-2 (COVID-19) by PCR.     COMPREHENSIVE METABOLIC PANEL - Normal   Na 138     Potassium 4.9     Cl 103     CO2 26     AGAP 9     Glucose 96     BUN 12     Creatinine 0.84     Ca 9.8     ALK PHOS 57     T Bili 0.3     Total Protein 6.9     Alb 4.4     GLOBULIN 2.5     ALBUMIN/GLOBULIN RATIO 1.8     BUN/CREAT RATIO 14.3     ALT 19     AST 21     eGFR 122     Comment: Normal GFR (glomerular filtration rate) > 60 mL/min/1.73 meters squared, < 60 may include impaired kidney function. Calculation based on the Chronic Kidney Disease Epidemiology Collaboration (CK-EPI)equation refit without adjustment for race.  LIPASE - Normal   Lipase 16      Imaging:   XR CHEST AP PORTABLE   Narrative:    XR CHEST AP PORTABLE  INDICATION: Chest Pain  COMPARISON: July 28, 2023  TECHNIQUE: One view.  FINDINGS:  The lungs are clear. The cardiomediastinal silhouette is within normal limits.     Impression:    IMPRESSION: 1.  No acute cardiopulmonary abnormality.  Electronically Signed by: Valma Companion, MD on 12/14/2023 7:57 AM  US  GALLBLADDER   Narrative:    US  GALLBLADDER  INDICATION: Epigastric Pain  COMPARISON: None Available at Time of Dictation  TECHNIQUE: Realtime multiplanar grayscale and limited color Doppler ultrasound of the right upper quadrant was performed.  FINDINGS: HEPATOBILIARY: Liver: Normal echotexture. No focal lesions.   Gallbladder: No gallstones. No gallbladder wall thickening. Gallbladder sludge. No pericholecystic fluid. Negative sonographic Murphy's sign. Common duct: Normal in caliber measuring 3 mm in maximal diameter.  PANCREAS:  Visualization is limited. No focal abnormalities are identified.  RIGHT KIDNEY: Right kidney is  normal in size measuring 9.6 x 3.8 cm. Normal renal echotexture. No hydronephrosis. No suspicious renal masses.  VASCULATURE: No abdominal aortic aneurysm. The visualized inferior vena cava is patent. The portal vein is patent with normal direction  of flow.   MISC: N./A.    Impression:    IMPRESSION: 1.  No acute abnormality. Gallbladder sludge. No definite gallstones.  Electronically Signed by: Valma Companion, MD on 12/14/2023 9:08 AM     ECG: ECG Results   None                                                                        Pre-Sedation Procedures    Medical Decision Making Aziah Jermol Michalsky presents to the ED with a variety of symptoms.  See below for discussion of diagnoses in further detail.  Problems Addressed: Neutropenia, unspecified type: undiagnosed new problem with uncertain prognosis    Details: WBC 3.8 in 2023 Possible viral suppression versus congenital abnormality.  Has a PCP appointment scheduled and will follow-up with his doctor. Right upper quadrant abdominal pain: acute illness or injury with systemic symptoms    Details: Abdomen is overall soft, minimally tender, and not suggestive of peritonitis or severe intra-abdominal pathology.  Cameron Caldwell does have gallbladder sludge.  We talked about the fact that Cameron Caldwell could have cholecystitis in the future.  Pain could be related to biliary colic today, but it seems less likely based on his overall minimally tender abdomen.  Cameron Caldwell was given careful return precautions.  I also ordered a chest x-ray to look for pneumonia given that Cameron Caldwell had other viral symptoms.  Cameron Caldwell was not endorsing any cough or significant dyspnea.  Chest x-ray was within normal limits. Viral URI: acute illness or injury with systemic symptoms    Details: Negative for COVID, flu, RSV.  Exposed to viral illness at home.  Likely explanatory for entire constellation of symptoms.  Symptomatic treatment  recommended.  Amount and/or Complexity of Data Reviewed External Data Reviewed: notes.    Details: Outside ED notes Labs: ordered. Decision-making details documented in ED Course. Radiology: ordered and independent interpretation performed. Decision-making details documented in ED Course.    Details: No infiltrate on chest x-ray  Risk Prescription drug management.          Provider Communication  New Prescriptions   No medications on file    Modified Medications   No medications on file    Discontinued Medications   No medications on file    Clinical Impression Final diagnoses:  Right upper quadrant abdominal pain  Viral URI  Neutropenia, unspecified type    ED Disposition     ED Disposition  Discharge   Condition  Stable   Comment  --                   Electronically signed by:       [1] History reviewed. No pertinent past medical history. [2] History reviewed. No pertinent surgical history. [3] Social History Tobacco Use  Smoking Status Not on file  Smokeless Tobacco Not on file  [4] Allergies Allergen Reactions   Amoxil [Amoxicillin] Abdominal Pain   Therisa KANDICE Silvan, MD 12/14/23 [phone]  "

## 2024-01-09 NOTE — Progress Notes (Signed)
 " Subjective   Patient ID:  Cameron Caldwell is a 28 y.o. (DOB 15-Oct-1995) male    Patient presents with   Establish Care    Establish care/Hospital Discharge (12/14/2023)     HPI: 29 year old male presents today to establish care.  Patient does have a past medical history of achondroplasia and asthma.  Patient is currently taking albuterol  inhaler as needed.  Patient also does have albuterol  nebulizer at home for more emergent situation.  Patient currently moved to the area with his fiance and 3 children.  Patient states that he is originally from [place] and has family that lives there.  Patient currently works for the [employer].  Patient notes that he enjoys what he does and states that it is quite stress-free.  Outside of work, patient enjoys spending time with his kids.  Back on 12/14/2023, patient went to the emergency room after experiencing right upper quadrant pain.  Patient notes that a couple of kids were sick at the time but was not sure if it was viral in origin.  Patient was eventually tested and imaging was completed.  It did make note of some biliary sludge but everything else was within normal limits.  Patient was instructed to follow-up with PCP accordingly.  Patient states that he was seeing a PCP in [facility] and had been on a combination of medications at that time to include oxycodone for back pain, clonazepam as needed for anxiety as well as paroxetine for depression.  Patient states he is no longer taking the paroxetine and notes that the time he was taking it, he did have a lot of stressors going on.  Patient is feeling much better at the moment and has no issues.  Patient is not also not taking any pain medications or anxiety medication at this time.  Patient notes that he has been having issues with attention and focus.  Patient states that this has really been apparent during the COVID-19 pandemic.  Patient has never been formally diagnosed with ADHD before but  would like to be referred to be tested.  Patient does have a good appetite.  Patient is hydrating adequately.  Patient denies nausea, vomiting, dysuria.   Reviewed and updated this visit by provider: Tobacco  Allergies  Meds  Problems  Med Hx  Surg Hx  Fam Hx        Review of Systems  Constitutional:  Negative for activity change, appetite change, chills, diaphoresis and fatigue.  HENT:  Negative for congestion, postnasal drip, rhinorrhea, sinus pressure, sinus pain and sore throat.   Respiratory:  Negative for cough, chest tightness and shortness of breath.   Cardiovascular:  Negative for chest pain.  Gastrointestinal:  Negative for abdominal pain, constipation, diarrhea and nausea.  Genitourinary:  Negative for dysuria, frequency, hematuria and urgency.  Musculoskeletal:  Negative for arthralgias and myalgias.  Neurological:  Negative for dizziness, weakness and headaches.  Psychiatric/Behavioral: Negative.       Objective   Vitals:   01/09/24 0923  BP: 126/84  Patient Position: Sitting  Pulse: 84  Temp: 97.5 F (36.4 C)  TempSrc: Temporal  Resp: 19  Height: 4' 8 (1.422 m)  Weight: 129 lb (58.5 kg)  SpO2: 96%  BMI (Calculated): 28.9  PainSc: 0-No pain     Physical Exam Constitutional:      Appearance: Normal appearance. He is normal weight.  HENT:     Head: Normocephalic and atraumatic.  Cardiovascular:     Rate and Rhythm:  Normal rate and regular rhythm.     Pulses: Normal pulses.     Heart sounds: Normal heart sounds.  Musculoskeletal:        General: Normal range of motion.  Pulmonary:     Effort: Pulmonary effort is normal.     Breath sounds: Normal breath sounds.  Abdominal:     General: Bowel sounds are normal.     Palpations: Abdomen is soft.  Skin:    General: Skin is warm and dry.     Capillary Refill: Capillary refill takes less than 2 seconds.  Neurological:     General: No focal deficit present.     Mental Status: He is alert and  oriented to person, place, and time. Mental status is at baseline.  Psychiatric:        Mood and Affect: Mood normal.        Behavior: Behavior normal.        Thought Content: Thought content normal.       Assessment and Plan  1. Mild intermittent asthma without complication (*) (Primary) -     albuterol  sulfate HFA (PROVENTIL ,VENTOLIN ,PROAIR ) 108 (90 Base) MCG/ACT inhaler; Inhale two puffs into the lungs every 6 (six) hours as needed for Wheezing., Starting Mon 01/09/2024, Normal 2. Attention deficit -     Ambulatory referral to Psychiatry/Medication Management; Future 3. Achondroplasia (*) 4. Encounter to establish care   1/2/3/4: Will continue albuterol  inhaler as needed.  Will also place referral to [place] attention specialist for evaluation for ADHD.  Encourage healthy lifestyle modification and diet.  Encouraged sleep hygiene.  Will patient follow-up in 3 months to reassess and evaluate.  Follow up in about 3 months (around 04/08/2024), or if symptoms worsen or fail to improve, for follow up,.      Patient's Medications  New Prescriptions   ALBUTEROL  SULFATE HFA (PROVENTIL ,VENTOLIN ,PROAIR ) 108 (90 BASE) MCG/ACT INHALER    Inhale two puffs into the lungs every 6 (six) hours as needed for Wheezing.  Previous Medications   ACETAMINOPHEN  (TYLENOL ) 500 MG TABLET    Take two tablets (1,000 mg dose) by mouth every 6 (six) hours as needed for Pain.   ALBUTEROL  (ACCUNEB ) 1.25 MG/3ML NEBULIZER SOLUTION    Take 3 mLs (1.25 mg dose) by nebulization every 6 (six) hours as needed for Wheezing.  Modified Medications   No medications on file  Discontinued Medications   No medications on file      Risks, benefits, and alternatives of the medications and treatment plan prescribed today were discussed, and patient expressed understanding. Plan follow-up as discussed or as needed if any worsening symptoms or change in condition.   A yearly preventative health exam was recommended and current age  based recommendations were discussed.  Note: parts of this documented may have been generated using voice recognition software. There may be unintended transcription errors that were not detected upon document review. If any correction is needed please let me know.   I have reviewed the information contained in this note and personally verified its accuracy.  Time based billing - I spent greater than 50% of the total time in direct patient care for this encounter. Fairy DELENA Slocumb, PA-C  Documentation for time-based billing:  Total time spent of date of service was 60 minutes.  Patient care activities included preparing to see the patient such as reviewing the patient record, obtaining and/or reviewing separately obtained history, performing a medically appropriate history and physical examination, counseling and educating the patient, family, and/or  caregiver, ordering prescription medications, tests, or procedures, and documenting clinical information in the electronic or other health record.  "

## 2024-01-25 ENCOUNTER — Emergency Department (HOSPITAL_BASED_OUTPATIENT_CLINIC_OR_DEPARTMENT_OTHER): Payer: Self-pay

## 2024-01-25 ENCOUNTER — Emergency Department (HOSPITAL_BASED_OUTPATIENT_CLINIC_OR_DEPARTMENT_OTHER)
Admission: EM | Admit: 2024-01-25 | Discharge: 2024-01-25 | Disposition: A | Discharge status: Home / Self Care | Payer: Self-pay | Attending: Emergency Medicine | Admitting: Emergency Medicine

## 2024-01-25 ENCOUNTER — Other Ambulatory Visit: Payer: Self-pay

## 2024-01-25 ENCOUNTER — Encounter (HOSPITAL_BASED_OUTPATIENT_CLINIC_OR_DEPARTMENT_OTHER): Payer: Self-pay | Admitting: Emergency Medicine

## 2024-01-25 DIAGNOSIS — R42 Dizziness and giddiness: Secondary | ICD-10-CM | POA: Insufficient documentation

## 2024-01-25 DIAGNOSIS — Z7951 Long term (current) use of inhaled steroids: Secondary | ICD-10-CM | POA: Insufficient documentation

## 2024-01-25 DIAGNOSIS — R55 Syncope and collapse: Secondary | ICD-10-CM | POA: Insufficient documentation

## 2024-01-25 DIAGNOSIS — J45909 Unspecified asthma, uncomplicated: Secondary | ICD-10-CM | POA: Insufficient documentation

## 2024-01-25 DIAGNOSIS — R002 Palpitations: Secondary | ICD-10-CM | POA: Insufficient documentation

## 2024-01-25 LAB — CBC
HCT: 40.9 % (ref 39.0–52.0)
Hemoglobin: 14.1 g/dL (ref 13.0–17.0)
MCH: 28.9 pg (ref 26.0–34.0)
MCHC: 34.5 g/dL (ref 30.0–36.0)
MCV: 83.8 fL (ref 80.0–100.0)
Platelets: 205 K/uL (ref 150–400)
RBC: 4.88 MIL/uL (ref 4.22–5.81)
RDW: 12.2 % (ref 11.5–15.5)
WBC: 5 K/uL (ref 4.0–10.5)
nRBC: 0 % (ref 0.0–0.2)

## 2024-01-25 LAB — COMPREHENSIVE METABOLIC PANEL WITH GFR
ALT: 32 U/L (ref 0–44)
AST: 18 U/L (ref 15–41)
Albumin: 4.2 g/dL (ref 3.5–5.0)
Alkaline Phosphatase: 45 U/L (ref 38–126)
Anion gap: 11 (ref 5–15)
BUN: 10 mg/dL (ref 6–20)
CO2: 24 mmol/L (ref 22–32)
Calcium: 9.2 mg/dL (ref 8.9–10.3)
Chloride: 105 mmol/L (ref 98–111)
Creatinine, Ser: 0.83 mg/dL (ref 0.61–1.24)
GFR, Estimated: >60 mL/min
Glucose, Bld: 130 mg/dL — ABNORMAL HIGH (ref 70–99)
Potassium: 3.9 mmol/L (ref 3.5–5.1)
Sodium: 139 mmol/L (ref 135–145)
Total Bilirubin: 0.4 mg/dL (ref 0.0–1.2)
Total Protein: 6.6 g/dL (ref 6.5–8.1)

## 2024-01-25 LAB — URINALYSIS, ROUTINE W REFLEX MICROSCOPIC
Bilirubin Urine: NEGATIVE
Glucose, UA: NEGATIVE mg/dL
Hgb urine dipstick: NEGATIVE
Ketones, ur: NEGATIVE mg/dL
Leukocytes,Ua: NEGATIVE
Nitrite: NEGATIVE
Protein, ur: NEGATIVE mg/dL
Specific Gravity, Urine: 1.015 (ref 1.005–1.030)
pH: 8 (ref 5.0–8.0)

## 2024-01-25 LAB — TROPONIN T, HIGH SENSITIVITY: Troponin T High Sensitivity: <15 ng/L (ref 0–19)

## 2024-01-25 LAB — CBG MONITORING, ED: Glucose-Capillary: 90 mg/dL (ref 70–99)

## 2024-01-25 NOTE — ED Triage Notes (Signed)
 Near syncope this morning at 0830 after he had his coffee.  No chest pain or dizziness , no Hx diabetes .  Alert and oriented x 4 , ambulatory to triage /  Reports Hx anxiety yet not feeling anxious at this time .

## 2024-01-25 NOTE — ED Provider Notes (Signed)
 " [facility] Provider Note   CSN: [id number] Arrival date & time: 01/25/24  1106     Patient presents with: Near Syncope   Cameron Caldwell is a 29 y.o. male with a history of asthma, achondroplasia, presented to ED with episode of palpitations and lightheadedness.  Patient reports he woke up in his usual state of health this morning feeling well.  He said he had a cup of coffee.  Shortly afterwards began to feel palpitations, dizzy and lightheadedness, around 8:30 in the morning.  The symptoms have fully resolved.  He says he has a history of anxiety but this feels different than anxiety attacks.  He denies any chest pressure or pain.  He does report that he had a viral type URI syndrome in the past week or so with some coughing and congestion.  In the past he has been diagnosed with pericarditis, but at that time he had chest pain   HPI     Prior to Admission medications  Medication Sig Start Date End Date Taking? Authorizing Provider  albuterol  (PROVENTIL ) (2.5 MG/3ML) 0.083% nebulizer solution Take 3 mLs (2.5 mg total) by nebulization every 6 (six) hours as needed for wheezing or shortness of breath. 05/23/20   Haze Lonni PARAS, MD  albuterol  (VENTOLIN  HFA) 108 (90 Base) MCG/ACT inhaler Inhale 2 puffs into the lungs every 4 (four) hours as needed for wheezing or shortness of breath. 05/23/20   Haze Lonni PARAS, MD  albuterol  (VENTOLIN  HFA) 108 (90 Base) MCG/ACT inhaler Inhale 1-2 puffs into the lungs every 6 (six) hours as needed for wheezing or shortness of breath. 05/13/21   Palumbo, April, MD  fluticasone  (FLONASE ) 50 MCG/ACT nasal spray Place 1 spray into both nostrils daily. 08/13/19   Petrucelli, Samantha R, PA-C  hydrOXYzine  (ATARAX /VISTARIL ) 25 MG tablet Take 1 tablet (25 mg total) by mouth every 8 (eight) hours as needed for anxiety. 06/10/20   Landy Honora CROME, PA-C  naproxen (NAPROSYN) 500 MG tablet TK 1 T PO BID 03/27/18    [provider]  omeprazole  (PRILOSEC) 20 MG capsule Take 1 capsule (20 mg total) by mouth daily. 05/13/21   Palumbo, April, MD  omeprazole  (PRILOSEC) 20 MG capsule Take 1 capsule (20 mg total) by mouth daily. 05/13/21   Palumbo, April, MD  ondansetron  (ZOFRAN -ODT) 4 MG disintegrating tablet Take 1 tablet (4 mg total) by mouth every 8 (eight) hours as needed for nausea or vomiting. 03/04/23   Neysa Caron PARAS, DO  oxyCODONE-acetaminophen  (PERCOCET/ROXICET) 5-325 MG tablet TK 1 T PO D 03/27/18   [provider]  pantoprazole  (PROTONIX ) 40 MG tablet Take 1 tablet (40 mg total) by mouth daily. 02/18/23   Long, Fonda MATSU, MD  predniSONE  (DELTASONE ) 20 MG tablet Take 2 tablets (40 mg total) by mouth daily with breakfast. 05/19/22   Blue, Soijett A, PA-C  QVAR REDIHALER 40 MCG/ACT inhaler INHALE 1 PUFF PO BID 10/28/17   [provider]    Allergies: Cat dander, Amoxicillin, and Bee pollen    Review of Systems  Updated Vital Signs BP (!) 141/76 (BP Location: Right Arm)   Pulse 87   Temp 98.2 F (36.8 C) (Oral)   Resp 16   Wt 59 kg   SpO2 97%   BMI 29.15 kg/m   Physical Exam Constitutional:      General: He is not in acute distress.    Comments: Achondroplasia  HENT:     Head: Normocephalic and  atraumatic.  Eyes:     Conjunctiva/sclera: Conjunctivae normal.     Pupils: Pupils are equal, round, and reactive to light.  Cardiovascular:     Rate and Rhythm: Normal rate and regular rhythm.     Pulses: Normal pulses.  Pulmonary:     Effort: Pulmonary effort is normal. No respiratory distress.  Abdominal:     General: There is no distension.     Tenderness: There is no abdominal tenderness.  Skin:    General: Skin is warm and dry.  Neurological:     General: No focal deficit present.     Mental Status: He is alert. Mental status is at baseline.  Psychiatric:        Mood and Affect: Mood normal.        Behavior: Behavior normal.     (all labs ordered are listed,  but only abnormal results are displayed) Labs Reviewed  COMPREHENSIVE METABOLIC PANEL WITH GFR - Abnormal; Notable for the following components:      Result Value   Glucose, Bld 130 (*)    All other components within normal limits  URINALYSIS, ROUTINE W REFLEX MICROSCOPIC  CBC  CBG MONITORING, ED  TROPONIN T, HIGH SENSITIVITY    EKG: EKG Interpretation Date/Time:  Wednesday January 25 2024 11:17:59 EST Ventricular Rate:  89 PR Interval:  156 QRS Duration:  93 QT Interval:  332 QTC Calculation: 404 R Axis:   89  Text Interpretation: Sinus rhythm Likely benign early repolarization pattern noted in the inferior and lateral leads, with no significant change from prior tracings Confirmed by Cottie Cough [phone]) on 01/25/2024 11:47:12 AM  Radiology: [facility] Chest Portable 1 View Result Date: 01/25/2024 CLINICAL DATA:  Chest pain and dizziness. EXAM: PORTABLE CHEST 1 VIEW COMPARISON:  May 23, 2023 FINDINGS: The heart size and mediastinal contours are within normal limits. Both lungs are clear. The visualized skeletal structures are unremarkable. IMPRESSION: No active disease. Electronically Signed   By: Suzen Dials M.D.   On: 01/25/2024 13:44     Procedures   Medications Ordered in the ED - No data to display                                  Medical Decision Making Amount and/or Complexity of Data Reviewed Labs: ordered. Radiology: ordered.   Patient is here with transient episode of lightheadedness earlier this morning.  Differential would include arrhythmia versus metabolic derangement versus anxiety versus viral illness versus other  His EKG per my interpretation shows normal sinus rhythm with benign early repolarization pattern consistent with his age and gender.  I do not see clear evidence of pericarditis on the EKG, nor does he have complaint of chest pain or shortness of breath.  A bedside echocardiogram performed myself was notable for good to normal EF, no large  pericardial effusion with tamponade effect  I personally reviewed and interpreted the patient's labs and imaging, notable for no emergent findings  Low suspicion for acute PE, dangerous arrhythmia, ACS, other life-threatening cause of near syncope.  I do recommend follow-up with his PCP.  Patient verbalized understanding     Final diagnoses:  Lightheadedness    ED Discharge Orders     None          Cottie Cough PARAS, MD 01/25/24 1404  "

## 2024-01-25 NOTE — ED Notes (Signed)
Lab called - blood hemolyzed.
